# Patient Record
Sex: Male | Born: 1970 | Race: Black or African American | Hispanic: No | Marital: Married | Smoking: Never smoker
Health system: Southern US, Community
[De-identification: ages and names within clinical notes are randomized; demographics above are authoritative.]

## PROBLEM LIST (undated history)

## (undated) DIAGNOSIS — I209 Angina pectoris, unspecified: Secondary | ICD-10-CM

## (undated) DIAGNOSIS — R609 Edema, unspecified: Secondary | ICD-10-CM

## (undated) DIAGNOSIS — E7800 Pure hypercholesterolemia, unspecified: Secondary | ICD-10-CM

## (undated) DIAGNOSIS — J189 Pneumonia, unspecified organism: Secondary | ICD-10-CM

## (undated) DIAGNOSIS — R7303 Prediabetes: Secondary | ICD-10-CM

## (undated) DIAGNOSIS — T4145XA Adverse effect of unspecified anesthetic, initial encounter: Secondary | ICD-10-CM

## (undated) DIAGNOSIS — M1990 Unspecified osteoarthritis, unspecified site: Secondary | ICD-10-CM

## (undated) DIAGNOSIS — K219 Gastro-esophageal reflux disease without esophagitis: Secondary | ICD-10-CM

## (undated) DIAGNOSIS — G4730 Sleep apnea, unspecified: Secondary | ICD-10-CM

## (undated) DIAGNOSIS — I10 Essential (primary) hypertension: Secondary | ICD-10-CM

## (undated) DIAGNOSIS — T8859XA Other complications of anesthesia, initial encounter: Secondary | ICD-10-CM

## (undated) HISTORY — PX: OTHER SURGICAL HISTORY: SHX169

## (undated) HISTORY — DX: Essential (primary) hypertension: I10

## (undated) HISTORY — DX: Pure hypercholesterolemia, unspecified: E78.00

---

## 1998-07-18 ENCOUNTER — Emergency Department (HOSPITAL_COMMUNITY): Admission: EM | Admit: 1998-07-18 | Discharge: 1998-07-19 | Payer: Self-pay | Admitting: Emergency Medicine

## 1998-08-25 ENCOUNTER — Emergency Department (HOSPITAL_COMMUNITY): Admission: EM | Admit: 1998-08-25 | Discharge: 1998-08-25 | Payer: Self-pay | Admitting: Emergency Medicine

## 1998-09-07 ENCOUNTER — Emergency Department (HOSPITAL_COMMUNITY): Admission: EM | Admit: 1998-09-07 | Discharge: 1998-09-07 | Payer: Self-pay | Admitting: Emergency Medicine

## 2000-01-02 ENCOUNTER — Emergency Department (HOSPITAL_COMMUNITY): Admission: EM | Admit: 2000-01-02 | Discharge: 2000-01-02 | Payer: Self-pay | Admitting: Emergency Medicine

## 2000-01-02 ENCOUNTER — Encounter: Payer: Self-pay | Admitting: Emergency Medicine

## 2000-02-24 ENCOUNTER — Emergency Department (HOSPITAL_COMMUNITY): Admission: EM | Admit: 2000-02-24 | Discharge: 2000-02-24 | Payer: Self-pay | Admitting: Emergency Medicine

## 2003-01-21 ENCOUNTER — Encounter: Payer: Self-pay | Admitting: Emergency Medicine

## 2003-01-21 ENCOUNTER — Emergency Department (HOSPITAL_COMMUNITY): Admission: EM | Admit: 2003-01-21 | Discharge: 2003-01-21 | Payer: Self-pay | Admitting: Emergency Medicine

## 2003-09-26 ENCOUNTER — Emergency Department (HOSPITAL_COMMUNITY): Admission: AD | Admit: 2003-09-26 | Discharge: 2003-09-26 | Payer: Self-pay | Admitting: Family Medicine

## 2005-06-26 ENCOUNTER — Emergency Department (HOSPITAL_COMMUNITY): Admission: EM | Admit: 2005-06-26 | Discharge: 2005-06-26 | Payer: Self-pay | Admitting: Emergency Medicine

## 2005-09-12 ENCOUNTER — Emergency Department (HOSPITAL_COMMUNITY): Admission: EM | Admit: 2005-09-12 | Discharge: 2005-09-12 | Payer: Self-pay | Admitting: Emergency Medicine

## 2005-10-12 ENCOUNTER — Encounter: Admission: RE | Admit: 2005-10-12 | Discharge: 2005-10-12 | Payer: Self-pay | Admitting: Neurology

## 2005-10-23 ENCOUNTER — Encounter: Admission: RE | Admit: 2005-10-23 | Discharge: 2005-10-23 | Payer: Self-pay | Admitting: Neurology

## 2006-03-01 ENCOUNTER — Encounter: Payer: Self-pay | Admitting: Emergency Medicine

## 2009-02-18 ENCOUNTER — Emergency Department (HOSPITAL_COMMUNITY): Admission: EM | Admit: 2009-02-18 | Discharge: 2009-02-18 | Payer: Self-pay | Admitting: Emergency Medicine

## 2010-03-22 ENCOUNTER — Ambulatory Visit (HOSPITAL_COMMUNITY): Admission: RE | Admit: 2010-03-22 | Discharge: 2010-03-22 | Payer: Self-pay | Admitting: Orthopedic Surgery

## 2010-04-29 ENCOUNTER — Emergency Department (HOSPITAL_COMMUNITY): Admission: EM | Admit: 2010-04-29 | Discharge: 2010-04-29 | Payer: Self-pay | Admitting: Emergency Medicine

## 2010-11-11 ENCOUNTER — Observation Stay (HOSPITAL_COMMUNITY)
Admission: EM | Admit: 2010-11-11 | Discharge: 2010-11-12 | Payer: Self-pay | Source: Home / Self Care | Attending: Internal Medicine | Admitting: Internal Medicine

## 2010-11-15 LAB — HEMOGLOBIN A1C
Hgb A1c MFr Bld: 6 % — ABNORMAL HIGH (ref <5.7)
Mean Plasma Glucose: 126 mg/dL — ABNORMAL HIGH (ref <117)

## 2010-11-15 LAB — CARDIAC PANEL(CRET KIN+CKTOT+MB+TROPI)
CK, MB: 1.8 ng/mL (ref 0.3–4.0)
CK, MB: 1.8 ng/mL (ref 0.3–4.0)
Relative Index: 0.9 (ref 0.0–2.5)
Relative Index: 0.9 (ref 0.0–2.5)
Total CK: 206 U/L (ref 7–232)
Total CK: 211 U/L (ref 7–232)
Troponin I: <0.01 ng/mL (ref 0.00–0.06)
Troponin I: <0.01 ng/mL (ref 0.00–0.06)

## 2010-11-15 LAB — CBC
HCT: 43.4 % (ref 39.0–52.0)
Hemoglobin: 14.5 g/dL (ref 13.0–17.0)
MCH: 27.7 pg (ref 26.0–34.0)
MCHC: 33.4 g/dL (ref 30.0–36.0)
MCV: 82.8 fL (ref 78.0–100.0)
Platelets: 274 10*3/uL (ref 150–400)
RBC: 5.24 MIL/uL (ref 4.22–5.81)
RDW: 15 % (ref 11.5–15.5)
WBC: 5.2 10*3/uL (ref 4.0–10.5)

## 2010-11-15 LAB — DIFFERENTIAL
Basophils Absolute: 0 10*3/uL (ref 0.0–0.1)
Basophils Relative: 0 % (ref 0–1)
Eosinophils Absolute: 0 10*3/uL (ref 0.0–0.7)
Eosinophils Relative: 1 % (ref 0–5)
Lymphocytes Relative: 36 % (ref 12–46)
Lymphs Abs: 1.9 10*3/uL (ref 0.7–4.0)
Monocytes Absolute: 0.4 10*3/uL (ref 0.1–1.0)
Monocytes Relative: 9 % (ref 3–12)
Neutro Abs: 2.8 10*3/uL (ref 1.7–7.7)
Neutrophils Relative %: 54 % (ref 43–77)

## 2010-11-15 LAB — URINALYSIS, ROUTINE W REFLEX MICROSCOPIC
Bilirubin Urine: NEGATIVE
Hgb urine dipstick: NEGATIVE
Ketones, ur: NEGATIVE mg/dL
Nitrite: NEGATIVE
Protein, ur: NEGATIVE mg/dL
Specific Gravity, Urine: 1.023 (ref 1.005–1.030)
Urine Glucose, Fasting: NEGATIVE mg/dL
Urobilinogen, UA: 0.2 mg/dL (ref 0.0–1.0)
pH: 6.5 (ref 5.0–8.0)

## 2010-11-15 LAB — BASIC METABOLIC PANEL
BUN: 10 mg/dL (ref 6–23)
CO2: 26 mEq/L (ref 19–32)
Calcium: 9.5 mg/dL (ref 8.4–10.5)
Chloride: 104 mEq/L (ref 96–112)
Creatinine, Ser: 0.97 mg/dL (ref 0.4–1.5)
GFR calc Af Amer: >60 mL/min (ref >60)
GFR calc non Af Amer: >60 mL/min (ref >60)
Glucose, Bld: 89 mg/dL (ref 70–99)
Potassium: 4 mEq/L (ref 3.5–5.1)
Sodium: 139 mEq/L (ref 135–145)

## 2010-11-15 LAB — POCT CARDIAC MARKERS
CKMB, poc: 1 ng/mL (ref 1.0–8.0)
Myoglobin, poc: 99.5 ng/mL (ref 12–200)
Troponin i, poc: <0.05 ng/mL (ref 0.00–0.09)

## 2010-11-15 LAB — GLUCOSE, CAPILLARY
Glucose-Capillary: 77 mg/dL (ref 70–99)
Glucose-Capillary: 98 mg/dL (ref 70–99)

## 2010-11-15 LAB — CK TOTAL AND CKMB (NOT AT ARMC)
CK, MB: 1.7 ng/mL (ref 0.3–4.0)
Relative Index: 0.9 (ref 0.0–2.5)
Total CK: 188 U/L (ref 7–232)

## 2010-11-15 LAB — T4, FREE: Free T4: 1.11 ng/dL (ref 0.80–1.80)

## 2010-11-15 LAB — TROPONIN I: Troponin I: <0.01 ng/mL (ref 0.00–0.06)

## 2010-11-15 LAB — TSH: TSH: 1.762 u[IU]/mL (ref 0.350–4.500)

## 2010-11-19 DIAGNOSIS — R7301 Impaired fasting glucose: Secondary | ICD-10-CM | POA: Insufficient documentation

## 2010-11-30 NOTE — Consult Note (Signed)
NAME:  Duane, Olsen NO.:  [id number]  MEDICAL RECORD NO.:  [id number]          PATIENT TYPE:  INP  LOCATION:  3702                         FACILITY:  [facility]  PHYSICIAN:  Vesta Mixer, M.D. DATE OF BIRTH:  08/10/1971  DATE OF CONSULTATION:  11/11/2010 DATE OF DISCHARGE:                                CONSULTATION   [name] is a 40 year old black gentleman with a history of prediabetes.  He presents to the ER with some atypical chest pains and we were asked to see him.  Mr. Niblack has a history of palpitations.  He has worn a heart monitor in the past, but does not recall the results.  He does not recall which doctor he saw at our office.  The patient woke up last night at 2 a.m. with episodes of chest discomfort.  His wife was sleeping on his arm and his arm felt tingling.  He was not able to get back to sleep and developed chest pain.  The chest pain hurt almost all day and he had not had much relief.  He has had several times when he did notice the chest pain as much.  He presented to the ER today because of persistent episodes of chest pain.  He denies any nausea or vomiting.  He denies any shortness of breath.  He denies any syncope or presyncope.  The pain is described as a dull heaviness.  It seems to be worse when he lies down.  It is better when he sits forward.  The patient has not been getting any regular exercise.  He has lost 50 pounds in improved diet.  CURRENT MEDICATIONS:  None.  ALLERGIES:  None.  PAST MEDICAL HISTORY:  Metabolic syndrome.  SOCIAL HISTORY:  The patient is a nonsmoker.  He drinks alcohol socially.  He is currently unemployed.  He used to work at [employer].  FAMILY HISTORY:  Positive for hypertension, coronary artery disease, and diabetes mellitus.  REVIEW OF SYSTEMS:  As noted in the HPI.  He has been on an improved diet.  He has lost 50 pounds since last June.  PHYSICAL EXAMINATION:  GENERAL:  He is  a middle-aged gentleman in no acute distress.  He is alert and oriented x3 and his mood and affect are normal. VITAL SIGNS:  His blood pressure is 121/60, his heart rate is 63. HEENT:  His mucous membranes are moist.  His sclerae are nonicteric. NECK:  Supple.  2+ carotids.  He has no bruits, no JVD, no thyromegaly. LUNGS:  Clear. HEART:  Regular rate.  S1 and S2.  His PMI is nondisplaced. ABDOMEN:  Good bowel sounds.  He is morbidly obese.  He has no hepatosplenomegaly. EXTREMITIES:  No clubbing, cyanosis, or edema.  He has no palpable cords. NEURO:  Cranial nerves II through XII are intact and his motor and sensory function are intact.  His EKG reveals normal sinus rhythm.  He has T-wave inversions in the lateral leads consistent with LVH.  LABORATORY DATA:  His cardiac enzymes were negative x1 (14 hours after onset of chest pain).  His white blood cell count  5.2, hemoglobin is 14.5, hematocrit is 43.4.  Sodium is 139, potassium is 4.0, chloride is 104, CO2 is 26, BUN is 10, creatinine is 0.97.  His point-of-care markers are negative as noted above.  Chest x-ray reveals mild cardiomegaly.  His lungs are clear.  IMPRESSION AND PLAN:  Chest pain.  The patient's chest pain is very atypical.  Point-of-care enzymes performed 14 hours after the onset of chest pain are negative.  My recommendation would be to continue to draw cardiac enzymes.  Given his obesity, he is not a candidate for stress echo because of the poor two-stick windows.  I also think that a stress Myoview study would be fairly difficult to interpret.  I think that it will be safe to discharge him to home if his enzymes are negative tomorrow.  He can follow up with [place] at the office as needed.     Vesta Mixer, M.D.     PJN/MEDQ  D:  11/11/2010  T:  11/12/2010  Job:  [id number]  cc:   Ace Gins, MD  Electronically Signed by Kristeen Miss M.D. on 11/30/2010 12:13:57 PM

## 2010-12-02 NOTE — Discharge Summary (Signed)
  NAMEKRISTAN, [name]             ACCOUNT NO.:  [id number]  MEDICAL RECORD NO.:  [id number]          PATIENT TYPE:  INP  LOCATION:  3702                         FACILITY:  [facility]  PHYSICIAN:  Ladell Pier, M.D.   DATE OF BIRTH:  12/21/1970  DATE OF ADMISSION:  11/11/2010 DATE OF DISCHARGE:  11/12/2010                              DISCHARGE SUMMARY   DISCHARGE DIAGNOSES: 1. Noncardiac chest pain. 2. Diabetes. 3. Obesity.  DISCHARGE MEDICATIONS: 1. Protonix 40 mg daily. 2. Aspirin 81 mg daily.  FOLLOWUP APPOINTMENTS:  The patient is to follow up with Dr. Larina Bras next week and also to call and schedule an appointment to follow up with [facility], Dr. Elease Hashimoto,  PROCEDURE:  Chest x-ray was normal.  CONSULTANTS:  [facility], Vesta Mixer, MD  HISTORY OF PRESENT ILLNESS:  This is an obese 41 year old African American gentleman, borderline diabetes, reports that when he checked his blood pressure it was always normal, his blood sugar was always normal, who does have a history of chest pains, associated with palpitations.  He was investigated before by [facility] with that a 30-day Holter monitor, was negative.  Please see detailed admission note.  PAST MEDICAL HISTORY:  Per admission H and P.  FAMILY HISTORY:  Per admission H and P.  SOCIAL HISTORY:  Per admission H and P.  MEDICATIONS:  Per admission H and P.  ALLERGIES:  Per admission H and P.  REVIEW OF SYSTEMS:  Per admission H and P.  PHYSICAL EXAMINATION:  VITAL SIGNS:  Temperature 98.1, pulse of 57, respirations 22, blood pressure 137/76, pulse ox 98% room air. GENERAL:  The patient is laying in bed, well-nourished African American male. HEENT:  Normocephalic, atraumatic.  Pupils reactive to light without erythema. CARDIOVASCULAR:  Regular rate and rhythm. LUNGS:  Clear bilaterally. ABDOMEN:  Positive bowel sounds. EXTREMITIES:  Without edema.  HOSPITAL COURSE: 1. Chest pain:   The patient was admitted to the hospital.  He had     serial cardiac markers done that were negative.  Cardiology     evaluated the patient and did not feel this was cardiac etiology.     He will follow up in the office with them.  Thyroid function is     also normal. 2. Borderline diabetes.  The patient will monitor blood sugar. 3. Borderline hypertension.  The patient will monitor blood pressure.  DISCHARGE LABORATORY DATA:  CK 206, MB 1.8, troponin 3.01.  Hemoglobin A1c 6.  TSH of 1.762.  Sodium 139, potassium 4.0, chloride 104, CO2 is 26, glucose 89, BUN 10, creatinine 0.97.  WBC 5.2, hemoglobin 14.5, MCV 82.8, platelet of 274.  Time spent with the patient doing this dictation is about 30 minutes.     Ladell Pier, M.D.     N[place]/MEDQ  D:  11/12/2010  T:  11/13/2010  Job:  [id number]  cc:   Dr. Larina Bras  Electronically Signed by Ladell Pier M.D. on 12/02/2010 01:35:22 PM

## 2010-12-16 DIAGNOSIS — F4322 Adjustment disorder with anxiety: Secondary | ICD-10-CM | POA: Insufficient documentation

## 2010-12-16 DIAGNOSIS — K219 Gastro-esophageal reflux disease without esophagitis: Secondary | ICD-10-CM | POA: Insufficient documentation

## 2011-02-07 ENCOUNTER — Emergency Department (HOSPITAL_COMMUNITY): Payer: Medicaid Other

## 2011-02-07 ENCOUNTER — Emergency Department (HOSPITAL_COMMUNITY)
Admission: EM | Admit: 2011-02-07 | Discharge: 2011-02-07 | Disposition: A | Discharge status: Home / Self Care | Payer: Medicaid Other | Attending: Emergency Medicine | Admitting: Emergency Medicine

## 2011-02-07 DIAGNOSIS — M25569 Pain in unspecified knee: Secondary | ICD-10-CM | POA: Insufficient documentation

## 2011-02-07 DIAGNOSIS — M25469 Effusion, unspecified knee: Secondary | ICD-10-CM | POA: Insufficient documentation

## 2011-02-09 LAB — DIFFERENTIAL
Basophils Absolute: 0 10*3/uL (ref 0.0–0.1)
Basophils Relative: 0 % (ref 0–1)
Eosinophils Absolute: 0 10*3/uL (ref 0.0–0.7)
Eosinophils Relative: 0 % (ref 0–5)
Lymphocytes Relative: 11 % — ABNORMAL LOW (ref 12–46)
Lymphs Abs: 1.1 10*3/uL (ref 0.7–4.0)
Monocytes Absolute: 0.8 10*3/uL (ref 0.1–1.0)
Monocytes Relative: 8 % (ref 3–12)
Neutro Abs: 8.5 10*3/uL — ABNORMAL HIGH (ref 1.7–7.7)
Neutrophils Relative %: 81 % — ABNORMAL HIGH (ref 43–77)

## 2011-02-09 LAB — POCT I-STAT, CHEM 8
BUN: 9 mg/dL (ref 6–23)
Calcium, Ion: 1.07 mmol/L — ABNORMAL LOW (ref 1.12–1.32)
Chloride: 104 mEq/L (ref 96–112)
Creatinine, Ser: 1.3 mg/dL (ref 0.4–1.5)
Glucose, Bld: 112 mg/dL — ABNORMAL HIGH (ref 70–99)
HCT: 50 % (ref 39.0–52.0)
Hemoglobin: 17 g/dL (ref 13.0–17.0)
Potassium: 4 mEq/L (ref 3.5–5.1)
Sodium: 139 mEq/L (ref 135–145)
TCO2: 27 mmol/L (ref 0–100)

## 2011-02-09 LAB — CBC
HCT: 45.4 % (ref 39.0–52.0)
Hemoglobin: 15.4 g/dL (ref 13.0–17.0)
MCHC: 33.9 g/dL (ref 30.0–36.0)
MCV: 84.5 fL (ref 78.0–100.0)
Platelets: 281 10*3/uL (ref 150–400)
RBC: 5.38 MIL/uL (ref 4.22–5.81)
RDW: 15.7 % — ABNORMAL HIGH (ref 11.5–15.5)
WBC: 10.4 10*3/uL (ref 4.0–10.5)

## 2011-02-09 LAB — RAPID STREP SCREEN (MED CTR MEBANE ONLY): Streptococcus, Group A Screen (Direct): NEGATIVE

## 2011-03-03 DIAGNOSIS — S8990XA Unspecified injury of unspecified lower leg, initial encounter: Secondary | ICD-10-CM | POA: Insufficient documentation

## 2011-03-03 DIAGNOSIS — S99919A Unspecified injury of unspecified ankle, initial encounter: Secondary | ICD-10-CM | POA: Insufficient documentation

## 2011-03-03 DIAGNOSIS — M2550 Pain in unspecified joint: Secondary | ICD-10-CM | POA: Insufficient documentation

## 2011-08-09 DIAGNOSIS — IMO0002 Reserved for concepts with insufficient information to code with codable children: Secondary | ICD-10-CM | POA: Insufficient documentation

## 2011-08-09 DIAGNOSIS — J329 Chronic sinusitis, unspecified: Secondary | ICD-10-CM | POA: Insufficient documentation

## 2011-08-19 DIAGNOSIS — N529 Male erectile dysfunction, unspecified: Secondary | ICD-10-CM | POA: Insufficient documentation

## 2011-08-26 DIAGNOSIS — G4700 Insomnia, unspecified: Secondary | ICD-10-CM | POA: Insufficient documentation

## 2011-08-26 DIAGNOSIS — F32A Depression, unspecified: Secondary | ICD-10-CM | POA: Insufficient documentation

## 2011-08-26 DIAGNOSIS — F329 Major depressive disorder, single episode, unspecified: Secondary | ICD-10-CM | POA: Insufficient documentation

## 2012-01-04 ENCOUNTER — Emergency Department (HOSPITAL_COMMUNITY)
Admission: EM | Admit: 2012-01-04 | Discharge: 2012-01-04 | Disposition: A | Discharge status: Home / Self Care | Payer: Medicaid Other | Attending: Emergency Medicine | Admitting: Emergency Medicine

## 2012-01-04 ENCOUNTER — Emergency Department (HOSPITAL_COMMUNITY): Payer: Medicaid Other

## 2012-01-04 ENCOUNTER — Encounter (HOSPITAL_COMMUNITY): Payer: Self-pay | Admitting: *Deleted

## 2012-01-04 ENCOUNTER — Other Ambulatory Visit: Payer: Self-pay

## 2012-01-04 DIAGNOSIS — W010XXA Fall on same level from slipping, tripping and stumbling without subsequent striking against object, initial encounter: Secondary | ICD-10-CM | POA: Insufficient documentation

## 2012-01-04 DIAGNOSIS — T1490XA Injury, unspecified, initial encounter: Secondary | ICD-10-CM | POA: Insufficient documentation

## 2012-01-04 DIAGNOSIS — R209 Unspecified disturbances of skin sensation: Secondary | ICD-10-CM | POA: Insufficient documentation

## 2012-01-04 DIAGNOSIS — M542 Cervicalgia: Secondary | ICD-10-CM | POA: Insufficient documentation

## 2012-01-04 DIAGNOSIS — R079 Chest pain, unspecified: Secondary | ICD-10-CM | POA: Insufficient documentation

## 2012-01-04 DIAGNOSIS — W19XXXA Unspecified fall, initial encounter: Secondary | ICD-10-CM

## 2012-01-04 DIAGNOSIS — R110 Nausea: Secondary | ICD-10-CM | POA: Insufficient documentation

## 2012-01-04 DIAGNOSIS — R51 Headache: Secondary | ICD-10-CM | POA: Insufficient documentation

## 2012-01-04 LAB — POCT I-STAT, CHEM 8
BUN: 10 mg/dL (ref 6–23)
Calcium, Ion: 1.16 mmol/L (ref 1.12–1.32)
Chloride: 103 mEq/L (ref 96–112)
Creatinine, Ser: 1.2 mg/dL (ref 0.50–1.35)
Glucose, Bld: 95 mg/dL (ref 70–99)
HCT: 47 % (ref 39.0–52.0)
Hemoglobin: 16 g/dL (ref 13.0–17.0)
Potassium: 3.8 mEq/L (ref 3.5–5.1)
Sodium: 142 mEq/L (ref 135–145)
TCO2: 27 mmol/L (ref 0–100)

## 2012-01-04 LAB — POCT I-STAT TROPONIN I
Troponin i, poc: 0 ng/mL (ref 0.00–0.08)
Troponin i, poc: 0 ng/mL (ref 0.00–0.08)

## 2012-01-04 MED ORDER — IBUPROFEN 800 MG PO TABS: 800.0000 mg | ORAL_TABLET | Freq: Three times a day (TID) | ORAL | Status: AC

## 2012-01-04 MED ORDER — HYDROCODONE-ACETAMINOPHEN 5-325 MG PO TABS
2.0000 | ORAL_TABLET | Freq: Once | ORAL | Status: AC
  Administered 2012-01-04: 2 via ORAL
  Filled 2012-01-04: qty 2

## 2012-01-04 NOTE — ED Notes (Signed)
Patient reports that he tripped over a toy landing on the top of his head. Patients states he now has severe posterior, neck, shoulder pain, and pain that radiates to his hips. Patient states yesterday he had numbness in his hands, but none today. Patient denies blurred vision. PERRL-58mm. MAE. C-collar on.

## 2012-01-04 NOTE — Discharge Instructions (Signed)
Chest Pain (Nonspecific) It is often hard to give a specific diagnosis for the cause of chest pain. There is always a chance that your pain could be related to something serious, such as a heart attack or a blood clot in the lungs. You need to follow up with your caregiver for further evaluation. CAUSES   Heartburn.   Pneumonia or bronchitis.   Anxiety or stress.   Inflammation around your heart (pericarditis) or lung (pleuritis or pleurisy).   A blood clot in the lung.   A collapsed lung (pneumothorax). It can develop suddenly on its own (spontaneous pneumothorax) or from injury (trauma) to the chest.   Shingles infection (herpes zoster virus).  The chest wall is composed of bones, muscles, and cartilage. Any of these can be the source of the pain.  The bones can be bruised by injury.   The muscles or cartilage can be strained by coughing or overwork.   The cartilage can be affected by inflammation and become sore (costochondritis).  DIAGNOSIS  Lab tests or other studies, such as X-rays, electrocardiography, stress testing, or cardiac imaging, may be needed to find the cause of your pain.  TREATMENT   Treatment depends on what may be causing your chest pain. Treatment may include:   Acid blockers for heartburn.   Anti-inflammatory medicine.   Pain medicine for inflammatory conditions.   Antibiotics if an infection is present.   You may be advised to change lifestyle habits. This includes stopping smoking and avoiding alcohol, caffeine, and chocolate.   You may be advised to keep your head raised (elevated) when sleeping. This reduces the chance of acid going backward from your stomach into your esophagus.   Most of the time, nonspecific chest pain will improve within 2 to 3 days with rest and mild pain medicine.  HOME CARE INSTRUCTIONS   If antibiotics were prescribed, take your antibiotics as directed. Finish them even if you start to feel better.   For the next few  days, avoid physical activities that bring on chest pain. Continue physical activities as directed.   Do not smoke.   Avoid drinking alcohol.   Only take over-the-counter or prescription medicine for pain, discomfort, or fever as directed by your caregiver.   Follow your caregiver's suggestions for further testing if your chest pain does not go away.   Keep any follow-up appointments you made. If you do not go to an appointment, you could develop lasting (chronic) problems with pain. If there is any problem keeping an appointment, you must call to reschedule.  SEEK MEDICAL CARE IF:   You think you are having problems from the medicine you are taking. Read your medicine instructions carefully.   Your chest pain does not go away, even after treatment.   You develop a rash with blisters on your chest.  SEEK IMMEDIATE MEDICAL CARE IF:   You have increased chest pain or pain that spreads to your arm, neck, jaw, back, or abdomen.   You develop shortness of breath, an increasing cough, or you are coughing up blood.   You have severe back or abdominal pain, feel nauseous, or vomit.   You develop severe weakness, fainting, or chills.   You have a fever.  THIS IS AN EMERGENCY. Do not wait to see if the pain will go away. Get medical help at once. Call your local emergency services (911 in [place] Do not drive yourself to the hospital. MAKE SURE YOU:   Understand these instructions.     Will watch your condition.   Will get help right away if you are not doing well or get worse.  Document Released: 07/27/2005 Document Revised: 10/06/2011 Document Reviewed: 05/22/2008 Exit[employer] Patient Information 2012 [employer]. 

## 2012-01-04 NOTE — ED Provider Notes (Signed)
History     CSN: [id number]  Arrival date & time 01/04/12  1412   First MD Initiated Contact with Patient 01/04/12 1453      Chief Complaint  Patient presents with  . Fall    pt reports falling on yesterday and landing on his head. pt c/o neck pain and back pain. states he feels numbness down legs. pt ambulatory at present. large c-collar applied in triage.     (Consider location/radiation/quality/duration/timing/severity/associated sxs/prior treatment) HPI Comments: Patient sustained a mechanical fall yesterday after tripping over his son.  He thought he was going to roll forward over him but landed on his head and back. Denies LOC.  Now having pain thoughout neck and back.  Denies bowel or bladder incontinence, fever, vomiting.  Today he felt some nausea and some right sided chest pain around noon which provoked him to seek evaluation today.  The pain did not radiate and now is gone.  This pain lasted about 15 minutes and did not radiate and is similar to chest pain that he had January 2012 when he was admitted and had a negative cardiac workup.  No SOB, diaphoresis, vomiting.  No weakness in legs.  The history is provided by the patient.    History reviewed. No pertinent past medical history.  Past Surgical History  Procedure Date  . Tonsillectomy     Family History  Problem Relation Age of Onset  . Diabetes Mother   . Hypertension Mother   . Hypertension Father   . Diabetes Father     History  Substance Use Topics  . Smoking status: Never Smoker   . Smokeless tobacco: Never Used  . Alcohol Use: Yes     once a month      Review of Systems  Constitutional: Negative for activity change and appetite change.  HENT: Positive for neck pain. Negative for congestion and rhinorrhea.   Respiratory: Positive for chest tightness. Negative for cough and shortness of breath.   Cardiovascular: Positive for chest pain. Negative for leg swelling.  Gastrointestinal: Positive for  nausea. Negative for vomiting and abdominal pain.  Genitourinary: Negative for dysuria and hematuria.  Musculoskeletal: Positive for back pain. Negative for gait problem.  Skin: Negative for wound.  Neurological: Positive for headaches. Negative for weakness.    Allergies  Review of patient's allergies indicates no known allergies.  Home Medications   Current Outpatient Rx  Name Route Sig Dispense Refill  . CALCIUM CARBONATE ANTACID 500 MG PO CHEW Oral Chew 2 tablets by mouth daily.    . IBUPROFEN 200 MG PO TABS Oral Take 800 mg by mouth every 6 (six) hours as needed. For pain    . IBUPROFEN 800 MG PO TABS Oral Take 1 tablet (800 mg total) by mouth 3 (three) times daily. 21 tablet 0    BP 151/74  Pulse 83  Temp(Src) 98.5 F (36.9 C) (Oral)  Resp 15  Wt 300 lb (136.079 kg)  SpO2 96%  Physical Exam  Constitutional: He appears well-developed and well-nourished. No distress.  HENT:  Head: Normocephalic and atraumatic.  Mouth/Throat: Oropharynx is clear and moist. No oropharyngeal exudate.  Eyes: Conjunctivae and EOM are normal. Pupils are equal, round, and reactive to light.  Neck: Normal range of motion. Neck supple.       Paraspinal muscle tenderness  Cardiovascular: Normal rate, regular rhythm and normal heart sounds.   Pulmonary/Chest: Effort normal and breath sounds normal. No respiratory distress. He exhibits tenderness.  Reproducible R chest wall tenderness  Abdominal: Soft. There is no tenderness. There is no rebound and no guarding.  Musculoskeletal: Normal range of motion. He exhibits tenderness.       Bilateral paraspinal thoracic and lumbar tenderness  Neurological: He is alert.       5/5 strength throughout, no facial droop, Great toe dorisflexion intact.  +2 DP and PT pulses.  +2 patellar reflexes.  Skin: Skin is warm.    ED Course  Procedures (including critical care time)   Labs Reviewed  POCT I-STAT, CHEM 8  POCT I-STAT TROPONIN I  POCT I-STAT  TROPONIN I   Dg Chest 2 View  01/04/2012  *RADIOLOGY REPORT*  Clinical Data: Chest pain.  CHEST - 2 VIEW  Comparison: 11/11/2010.  Findings: The cardiac silhouette, mediastinal and hilar contours are within normal limits and stable.  The lungs are clear.  No pleural effusion.  The bony thorax is intact.  IMPRESSION: No acute cardiopulmonary findings.  Original Report Authenticated By: P. Loralie Champagne, M.D.   Ct Head Wo Contrast  01/04/2012  *RADIOLOGY REPORT*  Clinical Data:  History of fall complaining of head and neck pain. Bilateral leg numbness.  CT HEAD WITHOUT CONTRAST CT CERVICAL SPINE WITHOUT CONTRAST  Technique:  Multidetector CT imaging of the head and cervical spine was performed following the standard protocol without intravenous contrast.  Multiplanar CT image reconstructions of the cervical spine were also generated.  Comparison:  CT head 02/18/2009.  CT HEAD  Findings: No acute intracranial abnormalities.  Specifically, no evidence of acute intracranial hemorrhage, mass, mass effect, hydrocephalus or abnormal intra or extra-axial fluid collections. No displaced skull fractures are noted.  Visualized paranasal sinuses and mastoids are well pneumatized.  IMPRESSION: 1.  No evidence of significant acute traumatic injury to the head. 2.  The appearance of the brain is normal.  CT CERVICAL SPINE  Findings: No acute displaced fracture of the cervical spine is noted.  Incomplete posterior ring of C1 (normal anatomical variant) incidentally noted.  At C2-C3 there is a prominent bridging anterior osteophyte.  Visualized lung apices are unremarkable.  No focal soft tissue abnormality is appreciated.  Specifically, prevertebral soft tissues are normal.  IMPRESSION: 1.  No evidence to suggest a significant acute traumatic injury to the cervical spine.  Original Report Authenticated By: Florencia Reasons, M.D.   Ct Cervical Spine Wo Contrast  01/04/2012  *RADIOLOGY REPORT*  Clinical Data:  History of fall  complaining of head and neck pain. Bilateral leg numbness.  CT HEAD WITHOUT CONTRAST CT CERVICAL SPINE WITHOUT CONTRAST  Technique:  Multidetector CT imaging of the head and cervical spine was performed following the standard protocol without intravenous contrast.  Multiplanar CT image reconstructions of the cervical spine were also generated.  Comparison:  CT head 02/18/2009.  CT HEAD  Findings: No acute intracranial abnormalities.  Specifically, no evidence of acute intracranial hemorrhage, mass, mass effect, hydrocephalus or abnormal intra or extra-axial fluid collections. No displaced skull fractures are noted.  Visualized paranasal sinuses and mastoids are well pneumatized.  IMPRESSION: 1.  No evidence of significant acute traumatic injury to the head. 2.  The appearance of the brain is normal.  CT CERVICAL SPINE  Findings: No acute displaced fracture of the cervical spine is noted.  Incomplete posterior ring of C1 (normal anatomical variant) incidentally noted.  At C2-C3 there is a prominent bridging anterior osteophyte.  Visualized lung apices are unremarkable.  No focal soft tissue abnormality is appreciated.  Specifically, prevertebral  soft tissues are normal.  IMPRESSION: 1.  No evidence to suggest a significant acute traumatic injury to the cervical spine.  Original Report Authenticated By: Florencia Reasons, M.D.     1. Fall   2. Chest pain       MDM  Mechanical fall with head, neck, back pain.  No LOC, no neuro deficits.  Now with R sided reproducible chest pain.  Patient has no traumatic injuries. He does have atypical right-sided chest pain is similar to what he had last year. Been going on for about an hour tonight.  EKG shows no changes, delta troponin is negative his pain is somewhat reproducible on exam. Is encouraged to followup with his primary care doctor for stress test      Date: 01/04/2012  Rate: 78  Rhythm: normal sinus rhythm  QRS Axis: normal  Intervals: normal   ST/T Wave abnormalities: nonspecific ST/T changes  Conduction Disutrbances:none  Narrative Interpretation: III T wave now upright  Old EKG Reviewed: unchanged    Glynn Octave, MD 01/04/12 2352

## 2012-03-20 ENCOUNTER — Ambulatory Visit (HOSPITAL_BASED_OUTPATIENT_CLINIC_OR_DEPARTMENT_OTHER): Payer: Medicaid Other

## 2012-03-29 ENCOUNTER — Ambulatory Visit (HOSPITAL_BASED_OUTPATIENT_CLINIC_OR_DEPARTMENT_OTHER): Payer: Medicaid Other

## 2012-03-29 ENCOUNTER — Emergency Department (HOSPITAL_COMMUNITY)
Admission: EM | Admit: 2012-03-29 | Discharge: 2012-03-29 | Discharge status: Against Medical Advice | Payer: Medicaid Other | Attending: Emergency Medicine | Admitting: Emergency Medicine

## 2012-03-29 ENCOUNTER — Encounter (HOSPITAL_COMMUNITY): Payer: Self-pay | Admitting: Emergency Medicine

## 2012-03-29 ENCOUNTER — Emergency Department (HOSPITAL_COMMUNITY): Payer: Medicaid Other

## 2012-03-29 DIAGNOSIS — R079 Chest pain, unspecified: Secondary | ICD-10-CM | POA: Insufficient documentation

## 2012-03-29 DIAGNOSIS — R55 Syncope and collapse: Secondary | ICD-10-CM | POA: Insufficient documentation

## 2012-03-29 DIAGNOSIS — R0602 Shortness of breath: Secondary | ICD-10-CM | POA: Insufficient documentation

## 2012-03-29 HISTORY — DX: Angina pectoris, unspecified: I20.9

## 2012-03-29 LAB — DIFFERENTIAL
Basophils Absolute: 0 10*3/uL (ref 0.0–0.1)
Basophils Relative: 0 % (ref 0–1)
Eosinophils Absolute: 0.1 10*3/uL (ref 0.0–0.7)
Eosinophils Relative: 1 % (ref 0–5)
Lymphocytes Relative: 34 % (ref 12–46)
Lymphs Abs: 2.7 10*3/uL (ref 0.7–4.0)
Monocytes Absolute: 0.8 10*3/uL (ref 0.1–1.0)
Monocytes Relative: 11 % (ref 3–12)
Neutro Abs: 4.3 10*3/uL (ref 1.7–7.7)
Neutrophils Relative %: 54 % (ref 43–77)

## 2012-03-29 LAB — BASIC METABOLIC PANEL
BUN: 12 mg/dL (ref 6–23)
CO2: 26 mEq/L (ref 19–32)
Calcium: 9.5 mg/dL (ref 8.4–10.5)
Chloride: 100 mEq/L (ref 96–112)
Creatinine, Ser: 0.95 mg/dL (ref 0.50–1.35)
GFR calc Af Amer: >90 mL/min (ref >90)
GFR calc non Af Amer: >90 mL/min (ref >90)
Glucose, Bld: 91 mg/dL (ref 70–99)
Potassium: 4.1 mEq/L (ref 3.5–5.1)
Sodium: 138 mEq/L (ref 135–145)

## 2012-03-29 LAB — CBC
HCT: 43.8 % (ref 39.0–52.0)
Hemoglobin: 14.6 g/dL (ref 13.0–17.0)
MCH: 28 pg (ref 26.0–34.0)
MCHC: 33.3 g/dL (ref 30.0–36.0)
MCV: 84.1 fL (ref 78.0–100.0)
Platelets: 327 10*3/uL (ref 150–400)
RBC: 5.21 MIL/uL (ref 4.22–5.81)
RDW: 14.4 % (ref 11.5–15.5)
WBC: 7.9 10*3/uL (ref 4.0–10.5)

## 2012-03-29 LAB — D-DIMER, QUANTITATIVE: D-Dimer, Quant: <0.22 ug/mL-FEU (ref 0.00–0.48)

## 2012-03-29 LAB — PRO B NATRIURETIC PEPTIDE: Pro B Natriuretic peptide (BNP): 70.3 pg/mL (ref 0–125)

## 2012-03-29 LAB — TROPONIN I: Troponin I: <0.3 ng/mL (ref <0.30)

## 2012-03-29 NOTE — ED Notes (Signed)
Pt presenting to ed with c/o being at work and developing left sided chest pain with shortness of breath, lightheadedness, and dizziness. Pt denies radiating pain at this time. Pt denies nausea and vomiting. Pt states he passed at at work. Pt states he fell onto his right side

## 2012-03-29 NOTE — ED Provider Notes (Signed)
History     CSN: [id number]  Arrival date & time 03/29/12  1630   First MD Initiated Contact with Patient 03/29/12 1704      Chief Complaint  Patient presents with  . Shortness of Breath  . Loss of Consciousness  . Chest Pain    (Consider location/radiation/quality/duration/timing/severity/associated sxs/prior treatment) Patient is a 41 y.o. male presenting with shortness of breath, syncope, and chest pain. The history is provided by the patient.  Shortness of Breath  The current episode started today. The problem has been gradually improving. The problem is moderate. The symptoms are relieved by nothing. Associated symptoms include chest pain and shortness of breath. Pertinent negatives include no fever and no cough. Associated symptoms comments: The patient reports onset of shortness of breath while standing talking with someone. He states his breathing became worse, he felt his heart "skipping" and had left sided chest discomfort. He reports brief episode of syncope. He reports a steady weight gain of over 50 pounds over the last 6 months and that his legs have begun to swell regularly. .  Loss of Consciousness Associated symptoms include chest pain. Pertinent negatives include no abdominal pain, coughing, fever, nausea or vomiting.  Chest Pain Primary symptoms include syncope and shortness of breath. Pertinent negatives for primary symptoms include no fever, no cough, no abdominal pain, no nausea and no vomiting.  There was no nausea. The syncopal episode occurred with shortness of breath.     Past Medical History  Diagnosis Date  . Anginal pain     04/2011 pt had agina, seen by cardiologist, no further treatment    Past Surgical History  Procedure Date  . Tonsillectomy     Family History  Problem Relation Age of Onset  . Diabetes Mother   . Hypertension Mother   . Hypertension Father   . Diabetes Father     History  Substance Use Topics  . Smoking status: Never  Smoker   . Smokeless tobacco: Never Used  . Alcohol Use: Yes     once a month      Review of Systems  Constitutional: Negative for fever.  Respiratory: Positive for shortness of breath. Negative for cough.   Cardiovascular: Positive for chest pain and syncope.  Gastrointestinal: Negative for nausea, vomiting and abdominal pain.  Neurological: Positive for syncope.    Allergies  Review of patient's allergies indicates no known allergies.  Home Medications   Current Outpatient Rx  Name Route Sig Dispense Refill  . ASPIRIN 81 MG PO CHEW Oral Chew 81 mg by mouth daily.      BP 157/82  Pulse 73  Temp(Src) 98.4 F (36.9 C) (Oral)  Resp 20  SpO2 98%  Physical Exam  Constitutional: He is oriented to person, place, and time. He appears well-developed and well-nourished. No distress.  HENT:  Head: Normocephalic.  Neck: Normal range of motion.  Cardiovascular: Normal rate and regular rhythm.   No murmur heard. Pulmonary/Chest: Effort normal. He has no wheezes. He has no rales. He exhibits no tenderness.  Abdominal: Soft. He exhibits no mass.       Obese abdomen that is soft, nontender.   Musculoskeletal: Normal range of motion. He exhibits edema.       Bilateral 1+ edema lower extremities.  Neurological: He is alert and oriented to person, place, and time. Coordination normal.  Skin: Skin is warm and dry.  Psychiatric: He has a normal mood and affect.    ED Course  Procedures (including  critical care time)   Labs Reviewed  CBC  DIFFERENTIAL  BASIC METABOLIC PANEL  D-DIMER, QUANTITATIVE  PRO B NATRIURETIC PEPTIDE  TROPONIN I   Results for orders placed during the hospital encounter of 03/29/12  CBC      Component Value Range   WBC 7.9  4.0 - 10.5 (K/uL)   RBC 5.21  4.22 - 5.81 (MIL/uL)   Hemoglobin 14.6  13.0 - 17.0 (g/dL)   HCT 16.1  09.6 - 04.5 (%)   MCV 84.1  78.0 - 100.0 (fL)   MCH 28.0  26.0 - 34.0 (pg)   MCHC 33.3  30.0 - 36.0 (g/dL)   RDW 40.9  81.1  - 91.4 (%)   Platelets 327  150 - 400 (K/uL)  DIFFERENTIAL      Component Value Range   Neutrophils Relative 54  43 - 77 (%)   Neutro Abs 4.3  1.7 - 7.7 (K/uL)   Lymphocytes Relative 34  12 - 46 (%)   Lymphs Abs 2.7  0.7 - 4.0 (K/uL)   Monocytes Relative 11  3 - 12 (%)   Monocytes Absolute 0.8  0.1 - 1.0 (K/uL)   Eosinophils Relative 1  0 - 5 (%)   Eosinophils Absolute 0.1  0.0 - 0.7 (K/uL)   Basophils Relative 0  0 - 1 (%)   Basophils Absolute 0.0  0.0 - 0.1 (K/uL)  BASIC METABOLIC PANEL      Component Value Range   Sodium 138  135 - 145 (mEq/L)   Potassium 4.1  3.5 - 5.1 (mEq/L)   Chloride 100  96 - 112 (mEq/L)   CO2 26  19 - 32 (mEq/L)   Glucose, Bld 91  70 - 99 (mg/dL)   BUN 12  6 - 23 (mg/dL)   Creatinine, Ser 7.82  0.50 - 1.35 (mg/dL)   Calcium 9.5  8.4 - 95.6 (mg/dL)   GFR calc non Af Amer >90  >90 (mL/min)   GFR calc Af Amer >90  >90 (mL/min)  D-DIMER, QUANTITATIVE      Component Value Range   D-Dimer, Quant <0.22  0.00 - 0.48 (ug/mL-FEU)  PRO B NATRIURETIC PEPTIDE      Component Value Range   Pro B Natriuretic peptide (BNP) 70.3  0 - 125 (pg/mL)  TROPONIN I      Component Value Range   Troponin I <0.30  <0.30 (ng/mL)  . Dg Chest Portable 1 View  03/29/2012  *RADIOLOGY REPORT*  Clinical Data: Shortness of breath with chest pain.  Loss of consciousness.  PORTABLE CHEST - 1 VIEW  Comparison: 01/04/2012  Findings:  Portable semi erect chest demonstrates low lung volumes with clear lung fields.  Cardiac and mediastinal silhouette within normal limits for the degree of inspiration.  No acute bony abnormality. No visible effusion or pneumothorax.  Worsening aeration compared with priors.  IMPRESSION: Low lung volumes.  No active infiltrates.  Original Report Authenticated By: Elsie Stain, M.D.   No results found.   No diagnosis found. 1. Syncope 2. Chest pain 3. SOB   MDM  The patient has been comfortable here without recurrent symptoms. Hospitalization was  recommended but patient declined and will sign out AMA. He has been advised of the risk of cardiac event that could result in death. He states he wants to go home regardless.         Rodena Medin, PA-C 03/29/12 2043

## 2012-03-29 NOTE — ED Notes (Signed)
Pt advised recommendation to stay.  Pt states he is not staying and MD is discharging home.

## 2012-03-29 NOTE — ED Notes (Addendum)
Pt presented to the ER with multiple complaints  States that about 10 min ago he was woken up by CP and while attempted to get up he experienced syncopal episode.

## 2012-03-29 NOTE — ED Notes (Signed)
Pt reports 1 hour ago developing central to right sided chest pain 10/10. Pt pt reports he felt "like couldn't catch his breath" and then lost consciousness for a few seconds and fell to ground on right side. C/o of right sided arm pain and right side of head/ neck pain. At present chest pain is 5/10. Arm pain 5/10. Pt has hx of 1 year ago having chest pain. Was seen by cardiologist who said "he had elevated enzymes". Cardiologist wanted pt to start taking cholesterol medication, but pt refused. Pt had hx of asthma at child. Denies n/v, blurry vision, or dizziness.

## 2012-03-29 NOTE — Discharge Instructions (Signed)
YOU HAVE BEEN ADVISED THAT HOSPITALIZATION IS RECOMMENDED AND THAT, BY CHOOSING TO LEAVE AND GO HOME, YOU ARE AT RISK FOR HAVING A CARDIAC EVENT THAT COULD BE FATAL. PLEASE RETURN ANYTIME YOU WANT FURTHER ASSESSMENT.

## 2012-03-29 NOTE — ED Provider Notes (Signed)
  I performed a history and physical examination of [name] and discussed his management with .Langley Adie I agree with the history, physical, assessment, and plan of care, with the following exceptions: None  I was present for the following procedures: None Time Spent in Critical Care of the patient: None Time spent in discussions with the patient and family:10  Patient advised admission.  Risk of death and disability discussed with patient. Patient does not want to stay and signed out ama.  Holli Humbles, MD 03/29/12 2330

## 2012-05-12 ENCOUNTER — Emergency Department (HOSPITAL_COMMUNITY): Payer: Medicaid Other

## 2012-05-12 ENCOUNTER — Encounter (HOSPITAL_COMMUNITY): Payer: Self-pay | Admitting: *Deleted

## 2012-05-12 ENCOUNTER — Emergency Department (HOSPITAL_COMMUNITY)
Admission: EM | Admit: 2012-05-12 | Discharge: 2012-05-12 | Disposition: A | Discharge status: Home / Self Care | Payer: Medicaid Other | Attending: Emergency Medicine | Admitting: Emergency Medicine

## 2012-05-12 DIAGNOSIS — X58XXXA Exposure to other specified factors, initial encounter: Secondary | ICD-10-CM | POA: Insufficient documentation

## 2012-05-12 DIAGNOSIS — K625 Hemorrhage of anus and rectum: Secondary | ICD-10-CM

## 2012-05-12 DIAGNOSIS — R109 Unspecified abdominal pain: Secondary | ICD-10-CM | POA: Insufficient documentation

## 2012-05-12 DIAGNOSIS — Z7982 Long term (current) use of aspirin: Secondary | ICD-10-CM | POA: Insufficient documentation

## 2012-05-12 DIAGNOSIS — IMO0002 Reserved for concepts with insufficient information to code with codable children: Secondary | ICD-10-CM | POA: Insufficient documentation

## 2012-05-12 DIAGNOSIS — K921 Melena: Secondary | ICD-10-CM | POA: Insufficient documentation

## 2012-05-12 DIAGNOSIS — M25569 Pain in unspecified knee: Secondary | ICD-10-CM | POA: Insufficient documentation

## 2012-05-12 DIAGNOSIS — S8390XA Sprain of unspecified site of unspecified knee, initial encounter: Secondary | ICD-10-CM

## 2012-05-12 DIAGNOSIS — Z79899 Other long term (current) drug therapy: Secondary | ICD-10-CM | POA: Insufficient documentation

## 2012-05-12 DIAGNOSIS — M25469 Effusion, unspecified knee: Secondary | ICD-10-CM | POA: Insufficient documentation

## 2012-05-12 LAB — COMPREHENSIVE METABOLIC PANEL
ALT: 20 U/L (ref 0–53)
AST: 18 U/L (ref 0–37)
Albumin: 3.8 g/dL (ref 3.5–5.2)
Alkaline Phosphatase: 64 U/L (ref 39–117)
BUN: 12 mg/dL (ref 6–23)
CO2: 26 mEq/L (ref 19–32)
Calcium: 9.1 mg/dL (ref 8.4–10.5)
Chloride: 103 mEq/L (ref 96–112)
Creatinine, Ser: 1 mg/dL (ref 0.50–1.35)
GFR calc Af Amer: >90 mL/min (ref >90)
GFR calc non Af Amer: >90 mL/min (ref >90)
Glucose, Bld: 91 mg/dL (ref 70–99)
Potassium: 3.8 mEq/L (ref 3.5–5.1)
Sodium: 137 mEq/L (ref 135–145)
Total Bilirubin: 0.5 mg/dL (ref 0.3–1.2)
Total Protein: 7.7 g/dL (ref 6.0–8.3)

## 2012-05-12 LAB — CBC WITH DIFFERENTIAL/PLATELET
Basophils Absolute: 0 10*3/uL (ref 0.0–0.1)
Basophils Relative: 1 % (ref 0–1)
Eosinophils Absolute: 0 10*3/uL (ref 0.0–0.7)
Eosinophils Relative: 1 % (ref 0–5)
HCT: 42.1 % (ref 39.0–52.0)
Hemoglobin: 14.4 g/dL (ref 13.0–17.0)
Lymphocytes Relative: 36 % (ref 12–46)
Lymphs Abs: 1.9 10*3/uL (ref 0.7–4.0)
MCH: 28.3 pg (ref 26.0–34.0)
MCHC: 34.2 g/dL (ref 30.0–36.0)
MCV: 82.7 fL (ref 78.0–100.0)
Monocytes Absolute: 0.4 10*3/uL (ref 0.1–1.0)
Monocytes Relative: 8 % (ref 3–12)
Neutro Abs: 2.9 10*3/uL (ref 1.7–7.7)
Neutrophils Relative %: 55 % (ref 43–77)
Platelets: 280 10*3/uL (ref 150–400)
RBC: 5.09 MIL/uL (ref 4.22–5.81)
RDW: 14.1 % (ref 11.5–15.5)
WBC: 5.3 10*3/uL (ref 4.0–10.5)

## 2012-05-12 MED ORDER — TRAMADOL HCL 50 MG PO TABS: 50.0000 mg | ORAL_TABLET | Freq: Four times a day (QID) | ORAL | Status: AC | PRN

## 2012-05-12 NOTE — ED Notes (Signed)
Pt reports he initially came in for right knee pain and then noted blood in stool this AM. Pt describes bright red blood in stool and reports he has noted smaller amounts of blood earlier in the week. Pt denies history of hemorrhoids. Pt reports generalized abdominal pain. Pt reports knee pain started a few hours ago after jumping off a step and twisting knee.

## 2012-05-12 NOTE — ED Provider Notes (Signed)
History     CSN: [id number]  Arrival date & time 05/12/12  1214   First MD Initiated Contact with Patient 05/12/12 1319      Chief Complaint  Patient presents with  . Rectal Bleeding  . Knee Pain  . Abdominal Pain    (Consider location/radiation/quality/duration/timing/severity/associated sxs/prior treatment) Patient is a 41 y.o. male presenting with hematochezia, knee pain, and abdominal pain. The history is provided by the patient (the pt complains of rectal bleeding and right knee pain). A language interpreter was used.  Rectal Bleeding  The current episode started today. The problem occurs rarely. The problem has been unchanged. The pain is mild. The stool is described as hard. There was no prior successful therapy. There was no prior unsuccessful therapy. Associated symptoms include abdominal pain. Pertinent negatives include no diarrhea, no hematuria, no chest pain, no headaches, no coughing and no rash.  Knee Pain Associated symptoms include abdominal pain. Pertinent negatives include no chest pain and no headaches.  Abdominal Pain The primary symptoms of the illness include abdominal pain and hematochezia. The primary symptoms of the illness do not include fatigue or diarrhea.  Symptoms associated with the illness do not include hematuria, frequency or back pain.    Past Medical History  Diagnosis Date  . Anginal pain     04/2011 pt had agina, seen by cardiologist, no further treatment    Past Surgical History  Procedure Date  . Tonsillectomy     Family History  Problem Relation Age of Onset  . Diabetes Mother   . Hypertension Mother   . Hypertension Father   . Diabetes Father     History  Substance Use Topics  . Smoking status: Never Smoker   . Smokeless tobacco: Never Used  . Alcohol Use: Yes     once a month      Review of Systems  Constitutional: Negative for fatigue.  HENT: Negative for congestion, sinus pressure and ear discharge.   Eyes:  Negative for discharge.  Respiratory: Negative for cough.   Cardiovascular: Negative for chest pain.  Gastrointestinal: Positive for abdominal pain and hematochezia. Negative for diarrhea.  Genitourinary: Negative for frequency and hematuria.  Musculoskeletal: Negative for back pain.       Pain in right knee  Skin: Negative for rash.  Neurological: Negative for seizures and headaches.  Hematological: Negative.   Psychiatric/Behavioral: Negative for hallucinations.    Allergies  Review of patient's allergies indicates no known allergies.  Home Medications   Current Outpatient Rx  Name Route Sig Dispense Refill  . ASPIRIN 81 MG PO CHEW Oral Chew 81 mg by mouth daily.    . ADULT MULTIVITAMIN W/MINERALS CH Oral Take 1 tablet by mouth daily.    . TRAMADOL HCL 50 MG PO TABS Oral Take 1 tablet (50 mg total) by mouth every 6 (six) hours as needed for pain. 30 tablet 0    BP 165/84  Pulse 61  Temp 98.1 F (36.7 C) (Oral)  Resp 18  SpO2 100%  Physical Exam  Constitutional: He is oriented to person, place, and time. He appears well-developed.  HENT:  Head: Normocephalic and atraumatic.  Eyes: Conjunctivae and EOM are normal. No scleral icterus.  Neck: Neck supple. No thyromegaly present.  Cardiovascular: Normal rate and regular rhythm.  Exam reveals no gallop and no friction rub.   No murmur heard. Pulmonary/Chest: No stridor. He has no wheezes. He has no rales. He exhibits no tenderness.  Abdominal: He exhibits no distension.  There is no tenderness. There is no rebound.  Genitourinary: Rectum normal.  Musculoskeletal:       Tender swollen right knee  Lymphadenopathy:    He has no cervical adenopathy.  Neurological: He is oriented to person, place, and time. Coordination normal.  Skin: No rash noted. No erythema.  Psychiatric: He has a normal mood and affect. His behavior is normal.    ED Course  Procedures (including critical care time)   Labs Reviewed  CBC WITH  DIFFERENTIAL  COMPREHENSIVE METABOLIC PANEL   Dg Knee Complete 4 Views Right  05/12/2012  *RADIOLOGY REPORT*  Clinical Data: Knee pain.  Jumping injury.  RIGHT KNEE - COMPLETE 4+ VIEW  Comparison: 02/07/2011  Findings: Minimal patellofemoral osteoarthritis.  A small suprapatellar joint effusion is again difficult to exclude. Appearance could partially be secondary patient body habitus.  IMPRESSION: Cannot exclude suprapatellar joint effusion.  Mild patellofemoral osteoarthritis. No acute osseous abnormality.  Original Report Authenticated By: Consuello Bossier, M.D.     1. Knee sprain   2. Rectal bleeding       MDM         Benny Lennert, MD 05/12/12 1539

## 2012-05-12 NOTE — ED Notes (Signed)
Ortho tech at bedside 

## 2012-06-02 ENCOUNTER — Other Ambulatory Visit: Payer: Self-pay | Admitting: Orthopedic Surgery

## 2012-06-02 DIAGNOSIS — M25569 Pain in unspecified knee: Secondary | ICD-10-CM

## 2012-06-06 ENCOUNTER — Ambulatory Visit
Admission: RE | Admit: 2012-06-06 | Discharge: 2012-06-06 | Disposition: A | Discharge status: Home / Self Care | Payer: Medicaid Other | Source: Ambulatory Visit | Attending: Orthopedic Surgery | Admitting: Orthopedic Surgery

## 2012-06-06 DIAGNOSIS — M25569 Pain in unspecified knee: Secondary | ICD-10-CM

## 2013-05-18 ENCOUNTER — Emergency Department (HOSPITAL_COMMUNITY)
Admission: EM | Admit: 2013-05-18 | Discharge: 2013-05-18 | Disposition: A | Discharge status: Home / Self Care | Payer: Medicaid Other | Attending: Emergency Medicine | Admitting: Emergency Medicine

## 2013-05-18 ENCOUNTER — Encounter (HOSPITAL_COMMUNITY): Payer: Self-pay

## 2013-05-18 ENCOUNTER — Emergency Department (HOSPITAL_COMMUNITY): Payer: Medicaid Other

## 2013-05-18 DIAGNOSIS — Z9119 Patient's noncompliance with other medical treatment and regimen: Secondary | ICD-10-CM | POA: Insufficient documentation

## 2013-05-18 DIAGNOSIS — R0789 Other chest pain: Secondary | ICD-10-CM | POA: Insufficient documentation

## 2013-05-18 DIAGNOSIS — R1110 Vomiting, unspecified: Secondary | ICD-10-CM | POA: Insufficient documentation

## 2013-05-18 DIAGNOSIS — Z7982 Long term (current) use of aspirin: Secondary | ICD-10-CM | POA: Insufficient documentation

## 2013-05-18 DIAGNOSIS — Z8679 Personal history of other diseases of the circulatory system: Secondary | ICD-10-CM | POA: Insufficient documentation

## 2013-05-18 DIAGNOSIS — G4733 Obstructive sleep apnea (adult) (pediatric): Secondary | ICD-10-CM | POA: Insufficient documentation

## 2013-05-18 DIAGNOSIS — Z91199 Patient's noncompliance with other medical treatment and regimen due to unspecified reason: Secondary | ICD-10-CM | POA: Insufficient documentation

## 2013-05-18 DIAGNOSIS — F17200 Nicotine dependence, unspecified, uncomplicated: Secondary | ICD-10-CM | POA: Insufficient documentation

## 2013-05-18 DIAGNOSIS — R0602 Shortness of breath: Secondary | ICD-10-CM | POA: Insufficient documentation

## 2013-05-18 DIAGNOSIS — R61 Generalized hyperhidrosis: Secondary | ICD-10-CM | POA: Insufficient documentation

## 2013-05-18 LAB — CBC WITH DIFFERENTIAL/PLATELET
Basophils Absolute: 0 10*3/uL (ref 0.0–0.1)
Basophils Relative: 0 % (ref 0–1)
Eosinophils Absolute: 0 10*3/uL (ref 0.0–0.7)
Eosinophils Relative: 1 % (ref 0–5)
HCT: 45.2 % (ref 39.0–52.0)
Hemoglobin: 15.3 g/dL (ref 13.0–17.0)
Lymphocytes Relative: 32 % (ref 12–46)
Lymphs Abs: 2 10*3/uL (ref 0.7–4.0)
MCH: 28 pg (ref 26.0–34.0)
MCHC: 33.8 g/dL (ref 30.0–36.0)
MCV: 82.8 fL (ref 78.0–100.0)
Monocytes Absolute: 0.6 10*3/uL (ref 0.1–1.0)
Monocytes Relative: 9 % (ref 3–12)
Neutro Abs: 3.5 10*3/uL (ref 1.7–7.7)
Neutrophils Relative %: 58 % (ref 43–77)
Platelets: 336 10*3/uL (ref 150–400)
RBC: 5.46 MIL/uL (ref 4.22–5.81)
RDW: 14.6 % (ref 11.5–15.5)
WBC: 6.1 10*3/uL (ref 4.0–10.5)

## 2013-05-18 LAB — COMPREHENSIVE METABOLIC PANEL
ALT: 17 U/L (ref 0–53)
AST: 20 U/L (ref 0–37)
Albumin: 4 g/dL (ref 3.5–5.2)
Alkaline Phosphatase: 78 U/L (ref 39–117)
BUN: 13 mg/dL (ref 6–23)
CO2: 25 mEq/L (ref 19–32)
Calcium: 9.6 mg/dL (ref 8.4–10.5)
Chloride: 99 mEq/L (ref 96–112)
Creatinine, Ser: 0.91 mg/dL (ref 0.50–1.35)
GFR calc Af Amer: >90 mL/min (ref >90)
GFR calc non Af Amer: >90 mL/min (ref >90)
Glucose, Bld: 97 mg/dL (ref 70–99)
Potassium: 4.4 mEq/L (ref 3.5–5.1)
Sodium: 136 mEq/L (ref 135–145)
Total Bilirubin: 0.4 mg/dL (ref 0.3–1.2)
Total Protein: 8.1 g/dL (ref 6.0–8.3)

## 2013-05-18 LAB — PRO B NATRIURETIC PEPTIDE: Pro B Natriuretic peptide (BNP): 33.2 pg/mL (ref 0–125)

## 2013-05-18 LAB — POCT I-STAT TROPONIN I: Troponin i, poc: 0 ng/mL (ref 0.00–0.08)

## 2013-05-18 LAB — D-DIMER, QUANTITATIVE: D-Dimer, Quant: <0.27 ug/mL-FEU (ref 0.00–0.48)

## 2013-05-18 LAB — TROPONIN I: Troponin I: <0.3 ng/mL (ref <0.30)

## 2013-05-18 NOTE — ED Notes (Signed)
PA Kelly at bedside. 

## 2013-05-18 NOTE — ED Notes (Addendum)
Confirmed with Zetta Bills in lab that the BNP is now added onto prior labs sent down. Shannon from lab called and the lavender clotted, will redraw; she stated that she will put in the order

## 2013-05-18 NOTE — ED Notes (Signed)
He states he was noted by co-workers at [employer] this morning that he had ceased his usual activity.  He noted clouded thinking for a brief interval, then noticed he was having central chest area discomfort, with some shortness of breath.  He denies diaphoresis.  He states he did vomit x 1 shortly after beginning of these sypmtoms.  He denies fever, nor any other sign of recent illness.  His skin is normal, warm and dry, and I do not see clinically any shortness of breath.  He states he takes a daily baby aspirin, which he states he took yesterday evening.

## 2013-05-18 NOTE — ED Notes (Addendum)
Pt is on RA now and states that he does not have any SOB, is not feeling as weak as when he came in and is now denying chest pain.  Pt states that he wants to go home.  RN encouraged him to wait for D-dimer results...RN explained what this test shows.

## 2013-05-18 NOTE — ED Provider Notes (Signed)
History    CSN: [id number] Arrival date & time 05/18/13  4098  First MD Initiated Contact with Patient 05/18/13 1015     Chief Complaint  Patient presents with  . Chest Pain   (Consider location/radiation/quality/duration/timing/severity/associated sxs/prior Treatment) HPI Comments: Patient is a 42 year old male with a history of sleep apnea and CPAP noncompliance who presents for central chest discomfort. Patient states the pain is sharp in nature and mildly radiating to his left chest. He states this sharp pain lasted a few seconds and was followed by a feeling of chest pressure. Patient denies any modifying factors, but states symptoms are associated with shortness of breath, diaphoresis, and one episode of nonbloody, nonbilious emesis. Patient states symptoms have been resolving since onset, but that he still feels come central pressure. He admits to a 5 pillow orthopnea. Patent denies associated fevers, vision changes, jaw pain, abdominal pain, numbness/tingling, extremity weakness, and leg swelling.  Patient is a 42 y.o. male presenting with chest pain. The history is provided by the patient. No language interpreter was used.  Chest Pain Associated symptoms: nausea, shortness of breath and vomiting (NB/NB)   Associated symptoms: no abdominal pain, no fever, no numbness and no weakness    Past Medical History  Diagnosis Date  . Anginal pain     04/2011 pt had agina, seen by cardiologist, no further treatment   Past Surgical History  Procedure Laterality Date  . Tonsillectomy     Family History  Problem Relation Age of Onset  . Diabetes Mother   . Hypertension Mother   . Hypertension Father   . Diabetes Father    History  Substance Use Topics  . Smoking status: Light Tobacco Smoker    Types: Cigars  . Smokeless tobacco: Never Used  . Alcohol Use: Yes     Comment: once a month    Review of Systems  Constitutional: Negative for fever.  Eyes: Negative for visual  disturbance.  Respiratory: Positive for shortness of breath.   Cardiovascular: Positive for chest pain.  Gastrointestinal: Positive for nausea and vomiting (NB/NB). Negative for abdominal pain.  Neurological: Negative for syncope, weakness and numbness.  All other systems reviewed and are negative.    Allergies  Review of patient's allergies indicates no known allergies.  Home Medications   Current Outpatient Rx  Name  Route  Sig  Dispense  Refill  . aspirin 81 MG chewable tablet   Oral   Chew 81 mg by mouth daily.         Marland Kitchen ibuprofen (ADVIL,MOTRIN) 200 MG tablet   Oral   Take 800 mg by mouth every 6 (six) hours as needed for pain.         . Multiple Vitamin (MULTIVITAMIN WITH MINERALS) TABS   Oral   Take 1 tablet by mouth daily.          BP 95/74  Pulse 69  Temp(Src) 98.7 F (37.1 C) (Oral)  Resp 18  Ht 5\' 5"  (1.651 m)  Wt 295 lb (133.811 kg)  BMI 49.09 kg/m2  SpO2 96%  Physical Exam  Nursing note and vitals reviewed. Constitutional: He is oriented to person, place, and time. He appears well-developed and well-nourished. No distress.  Obese male. Well and nontoxic appearing, and in no acute distress.  HENT:  Head: Normocephalic and atraumatic.  Mouth/Throat: Oropharynx is clear and moist. No oropharyngeal exudate.  Eyes: Conjunctivae and EOM are normal. Pupils are equal, round, and reactive to light. No scleral icterus.  Neck: Normal range of motion.  Cardiovascular: Normal rate, regular rhythm, normal heart sounds and intact distal pulses.   Pulmonary/Chest: No respiratory distress. He has no wheezes. He has no rales. He exhibits tenderness. He exhibits no crepitus, no deformity and no swelling.    Abdominal: Soft. There is no tenderness. There is no rebound and no guarding.  Soft obese abdomen with no tenderness to palpation  Musculoskeletal: Normal range of motion.  Neurological: He is alert and oriented to person, place, and time.  Skin: Skin is warm  and dry. No rash noted. He is not diaphoretic. No erythema. No pallor.  Psychiatric: He has a normal mood and affect. His behavior is normal.    ED Course  Procedures (including critical care time) Labs Reviewed  COMPREHENSIVE METABOLIC PANEL  PRO B NATRIURETIC PEPTIDE  CBC WITH DIFFERENTIAL  D-DIMER, QUANTITATIVE  TROPONIN I  CBC WITH DIFFERENTIAL  POCT I-STAT TROPONIN I   Dg Chest 2 View  05/18/2013   *RADIOLOGY REPORT*  Clinical Data: Chest pain, shortness of breath, nausea and vomiting  CHEST - 2 VIEW  Comparison: 03/29/2012  Findings: The heart size is normal.  There is mild central vascular congestion with no evidence of pulmonary edema or infiltrate. There are no pleural effusions.  IMPRESSION: Mild central vascular congestion.  Otherwise negative.   Original Report Authenticated By: Esperanza Heir, M.D.    Date: 05/19/2013  Rate: 65  Rhythm: sinus arrhythmia  QRS Axis: normal  Intervals: normal  ST/T Wave abnormalities: normal and nonspecific T wave changes  Conduction Disutrbances:none  Narrative Interpretation: Normal EKG; no STEMI  Old EKG Reviewed: unchanged from 01/04/2012 I have personally reviewed and interpreted this EKG.  1. Atypical chest pain    MDM  42 y/o male presents for chest pain with onset this AM. Patient well and nontoxic appearing on arrival and hemodynamically stable. Physical exam as above. Doubt ACS in this patient given atypical nature of symptoms, physical exam findings, and reassuring work up. No evidence of pneumonia, PTX, pleural effusion, or other acute cardiopulmonary abnormality on CXR. BNP normal and D dimer WNL, effectively r/o pulmonary embolism. Likely that symptoms were either reflux related or MSK in origin. Patient appropriate for d/c with PCP follow for further evaluation of symptoms. Indications for return provided and patient agreeable to plan.    Antony Madura, PA-C 05/19/13 2110

## 2013-05-18 NOTE — ED Provider Notes (Signed)
Medical screening examination/treatment/procedure(s) were conducted as a shared visit with non-physician practitioner(s) and myself.  I personally evaluated the patient during the encounter  Subjective: Patient here after having nausea vomiting with some epigastric chest discomfort which lasted for a few seconds.  Objective: Afebrile vital signs stable. EKG without ischemic changes.  Assessment/plan: We'll cycle troponins and likely discharge. Do not think that the patient has ACS or PE. Suspect GI source  Toy Baker, MD 05/18/13 [phone]

## 2013-05-20 NOTE — ED Provider Notes (Signed)
Medical screening examination/treatment/procedure(s) were performed by non-physician practitioner and as supervising physician I was immediately available for consultation/collaboration.  Darrelle Wiberg T Saree Krogh, MD 05/20/13 1117 

## 2013-05-23 ENCOUNTER — Emergency Department (HOSPITAL_COMMUNITY)
Admission: EM | Admit: 2013-05-23 | Discharge: 2013-05-23 | Disposition: A | Discharge status: Home / Self Care | Payer: Medicaid Other | Attending: Emergency Medicine | Admitting: Emergency Medicine

## 2013-05-23 ENCOUNTER — Encounter (HOSPITAL_COMMUNITY): Payer: Self-pay | Admitting: Emergency Medicine

## 2013-05-23 ENCOUNTER — Emergency Department (HOSPITAL_COMMUNITY): Payer: Medicaid Other

## 2013-05-23 DIAGNOSIS — R110 Nausea: Secondary | ICD-10-CM | POA: Insufficient documentation

## 2013-05-23 DIAGNOSIS — E669 Obesity, unspecified: Secondary | ICD-10-CM | POA: Insufficient documentation

## 2013-05-23 DIAGNOSIS — Z79899 Other long term (current) drug therapy: Secondary | ICD-10-CM | POA: Insufficient documentation

## 2013-05-23 DIAGNOSIS — R0602 Shortness of breath: Secondary | ICD-10-CM | POA: Insufficient documentation

## 2013-05-23 DIAGNOSIS — R079 Chest pain, unspecified: Secondary | ICD-10-CM

## 2013-05-23 DIAGNOSIS — Z9119 Patient's noncompliance with other medical treatment and regimen: Secondary | ICD-10-CM | POA: Insufficient documentation

## 2013-05-23 DIAGNOSIS — G4730 Sleep apnea, unspecified: Secondary | ICD-10-CM | POA: Insufficient documentation

## 2013-05-23 DIAGNOSIS — Z8679 Personal history of other diseases of the circulatory system: Secondary | ICD-10-CM | POA: Insufficient documentation

## 2013-05-23 DIAGNOSIS — Z7982 Long term (current) use of aspirin: Secondary | ICD-10-CM | POA: Insufficient documentation

## 2013-05-23 DIAGNOSIS — F17200 Nicotine dependence, unspecified, uncomplicated: Secondary | ICD-10-CM | POA: Insufficient documentation

## 2013-05-23 DIAGNOSIS — Z91199 Patient's noncompliance with other medical treatment and regimen due to unspecified reason: Secondary | ICD-10-CM | POA: Insufficient documentation

## 2013-05-23 LAB — CBC WITH DIFFERENTIAL/PLATELET
Basophils Absolute: 0 10*3/uL (ref 0.0–0.1)
Basophils Relative: 0 % (ref 0–1)
Eosinophils Absolute: 0 10*3/uL (ref 0.0–0.7)
Eosinophils Relative: 1 % (ref 0–5)
HCT: 41.8 % (ref 39.0–52.0)
Hemoglobin: 13.8 g/dL (ref 13.0–17.0)
Lymphocytes Relative: 30 % (ref 12–46)
Lymphs Abs: 2.1 10*3/uL (ref 0.7–4.0)
MCH: 27.7 pg (ref 26.0–34.0)
MCHC: 33 g/dL (ref 30.0–36.0)
MCV: 83.8 fL (ref 78.0–100.0)
Monocytes Absolute: 0.6 10*3/uL (ref 0.1–1.0)
Monocytes Relative: 8 % (ref 3–12)
Neutro Abs: 4.3 10*3/uL (ref 1.7–7.7)
Neutrophils Relative %: 61 % (ref 43–77)
Platelets: 308 10*3/uL (ref 150–400)
RBC: 4.99 MIL/uL (ref 4.22–5.81)
RDW: 14.5 % (ref 11.5–15.5)
WBC: 7 10*3/uL (ref 4.0–10.5)

## 2013-05-23 LAB — TROPONIN I: Troponin I: <0.3 ng/mL (ref <0.30)

## 2013-05-23 LAB — BASIC METABOLIC PANEL
BUN: 11 mg/dL (ref 6–23)
CO2: 27 mEq/L (ref 19–32)
Calcium: 9.1 mg/dL (ref 8.4–10.5)
Chloride: 101 mEq/L (ref 96–112)
Creatinine, Ser: 1.22 mg/dL (ref 0.50–1.35)
GFR calc Af Amer: 82 mL/min — ABNORMAL LOW (ref >90)
GFR calc non Af Amer: 71 mL/min — ABNORMAL LOW (ref >90)
Glucose, Bld: 115 mg/dL — ABNORMAL HIGH (ref 70–99)
Potassium: 3.9 mEq/L (ref 3.5–5.1)
Sodium: 138 mEq/L (ref 135–145)

## 2013-05-23 LAB — PRO B NATRIURETIC PEPTIDE: Pro B Natriuretic peptide (BNP): 49.4 pg/mL (ref 0–125)

## 2013-05-23 MED ORDER — ASPIRIN 81 MG PO CHEW
324.0000 mg | CHEWABLE_TABLET | Freq: Once | ORAL | Status: AC
  Administered 2013-05-23: 324 mg via ORAL
  Filled 2013-05-23: qty 4

## 2013-05-23 NOTE — ED Provider Notes (Signed)
[name] is a 42 y.o. male who complains of transient symptoms for several days, that are recurrent. He was evaluated here 5 days ago for similar problems. The primary issue today is a sensation of shortness of breath; that came on without cause. It is largely gone now. He had transient chest pain, today, and has had some left arm pain in the last several days that comes and goes. Sometimes he feels tingling in his left hand. He denies any weakness, dizziness, nausea, vomiting, fever, or chills. He is concerned about changing a job at work and is "trying to get a promotion". He relates typical marital arguments with his wife. She thinks that he has an anxiety problem. He has family members with MI at a young age. He has never been evaluated for cardiac disease. Exam alert, cooperative; he is obese. Heart regular rate and rhythm. No murmur. Lungs clear to auscultation. Neurologic is nonfocal  Assessment: Nonspecific shortness of breath and chest pain. Doubt ACS, PE, or pneumonia. He is stable for discharge with outpatient management  Plan: Follow up with cardiology for outpatient stress test. Have encouraged him to get a PCP, for ongoing management.  Medical screening examination/treatment/procedure(s) were conducted as a shared visit with non-physician practitioner(s) and myself.  I personally evaluated the patient during the encounter  Flint Melter, MD 05/24/13 [phone]

## 2013-05-23 NOTE — ED Notes (Signed)
Pt complains of central chest pain lasting approx 30 minutes. Pt reports shortness of breath with chest pain.

## 2013-05-23 NOTE — ED Provider Notes (Signed)
CSN: [id number]     Arrival date & time 05/23/13  1546 History     First MD Initiated Contact with Patient 05/23/13 1646     Chief Complaint  Patient presents with  . Chest Pain   (Consider location/radiation/quality/duration/timing/severity/associated sxs/prior Treatment) HPI Comments: Patient is a 42 year old male with a past medical history of sleep apnea and noncompliance with CPAP, obesity and anginal chest pain who presents with chest pain that started about 30 minutes prior to arrival. Symptoms started gradually and progressively worsened since the onset. The pain is located in his central chest without radiation. The pain is dull and severe. Associated SOB and some nausea. No aggravating/alleviating factors. Patient has continued to have pain, making the pain continuous for the past few hours. Patient has not tried anything for pain relief. Patient denies any recent travel, recent surgery, previous DVT/PE.  Patient is a 42 y.o. male presenting with chest pain.  Chest Pain Associated symptoms: shortness of breath     Past Medical History  Diagnosis Date  . Anginal pain     04/2011 pt had agina, seen by cardiologist, no further treatment   Past Surgical History  Procedure Laterality Date  . Tonsillectomy     Family History  Problem Relation Age of Onset  . Diabetes Mother   . Hypertension Mother   . Hypertension Father   . Diabetes Father    History  Substance Use Topics  . Smoking status: Light Tobacco Smoker    Types: Cigars  . Smokeless tobacco: Never Used  . Alcohol Use: Yes     Comment: once a month    Review of Systems  Respiratory: Positive for shortness of breath.   Cardiovascular: Positive for chest pain.  All other systems reviewed and are negative.    Allergies  Review of patient's allergies indicates no known allergies.  Home Medications   Current Outpatient Rx  Name  Route  Sig  Dispense  Refill  . aspirin 81 MG chewable tablet   Oral  Chew 81 mg by mouth daily.         Marland Kitchen ibuprofen (ADVIL,MOTRIN) 200 MG tablet   Oral   Take 800 mg by mouth every 8 (eight) hours as needed for pain.          . Multiple Vitamin (MULTIVITAMIN WITH MINERALS) TABS   Oral   Take 1 tablet by mouth daily.          BP 136/61  Pulse 88  Temp(Src) 98 F (36.7 C) (Oral)  Resp 24  SpO2 95% Physical Exam  Nursing note and vitals reviewed. Constitutional: He is oriented to person, place, and time. He appears well-developed and well-nourished. No distress.  HENT:  Head: Normocephalic and atraumatic.  Eyes: Conjunctivae are normal.  Neck: Normal range of motion.  Cardiovascular: Normal rate, regular rhythm and intact distal pulses.  Exam reveals no gallop and no friction rub.   No murmur heard. Pulmonary/Chest: Effort normal and breath sounds normal. He has no wheezes. He has no rales. He exhibits no tenderness.  Abdominal: Soft. He exhibits no distension. There is no tenderness. There is no rebound and no guarding.  Musculoskeletal: Normal range of motion.  No lower extremity edema or calf tenderness to palpation.   Neurological: He is alert and oriented to person, place, and time. Coordination normal.  Speech is goal-oriented. Moves limbs without ataxia.   Skin: Skin is warm and dry.  Psychiatric: He has a normal mood and affect.  His behavior is normal.    ED Course   Procedures (including critical care time)   Date: 05/23/2013  Rate: 78  Rhythm: normal sinus rhythm  QRS Axis: normal  Intervals: normal  ST/T Wave abnormalities: nonspecific T wave changes  Conduction Disutrbances:none  Narrative Interpretation: NSR with nonspecific T wave changes unchanged from previous  Old EKG Reviewed: unchanged   Labs Reviewed  BASIC METABOLIC PANEL - Abnormal; Notable for the following:    Glucose, Bld 115 (*)    GFR calc non Af Amer 71 (*)    GFR calc Af Amer 82 (*)    All other components within normal limits  TROPONIN I  CBC  WITH DIFFERENTIAL  PRO B NATRIURETIC PEPTIDE   Dg Chest 2 View  05/23/2013   *RADIOLOGY REPORT*  Clinical Data: History of chest pain.  History of smoking.  CHEST - 2 VIEW  Comparison: 05/18/2013.  Findings: Cardiac silhouette is normal size and shape.  Mediastinal and hilar contours appear stable.  No peripheral infiltrate or consolidation is seen.  There is minimal central peribronchial thickening. No pleural abnormality is evident. Bones appear average for age.  IMPRESSION: Minimal central peribronchial thickening.  This may be associated with bronchitis, asthma, and reactive airway disease.  No consolidation or pleural effusion evident.   Original Report Authenticated By: Onalee Hua Call   1. Chest pain     MDM  5:28 PM Labs unremarkable for acute changes. Chest xray unchanged from 5 days ago. Troponin negative. No EKG changes. Patient is PERC negative. Vitals stable and patient afebrile.   6:04 PM Dr. Effie Shy saw the patient who thinks the patient can go home. Patient will have a follow up appointment with [facility]. Patient instructed to return with worsening or concerning symptoms. Vitals stable and patient afebrile. No further evaluation needed at this time.       Emilia Beck, PA-C 05/23/13 1810

## 2013-12-30 ENCOUNTER — Encounter (HOSPITAL_COMMUNITY): Payer: Self-pay | Admitting: Emergency Medicine

## 2013-12-30 ENCOUNTER — Emergency Department (HOSPITAL_COMMUNITY)
Admission: EM | Admit: 2013-12-30 | Discharge: 2013-12-30 | Disposition: A | Discharge status: Home / Self Care | Payer: Medicaid Other | Attending: Emergency Medicine | Admitting: Emergency Medicine

## 2013-12-30 ENCOUNTER — Emergency Department (HOSPITAL_COMMUNITY): Payer: Medicaid Other

## 2013-12-30 DIAGNOSIS — R079 Chest pain, unspecified: Secondary | ICD-10-CM

## 2013-12-30 DIAGNOSIS — R0600 Dyspnea, unspecified: Secondary | ICD-10-CM

## 2013-12-30 DIAGNOSIS — R0609 Other forms of dyspnea: Secondary | ICD-10-CM | POA: Insufficient documentation

## 2013-12-30 DIAGNOSIS — Z7982 Long term (current) use of aspirin: Secondary | ICD-10-CM | POA: Insufficient documentation

## 2013-12-30 DIAGNOSIS — R42 Dizziness and giddiness: Secondary | ICD-10-CM | POA: Insufficient documentation

## 2013-12-30 DIAGNOSIS — R0789 Other chest pain: Secondary | ICD-10-CM | POA: Insufficient documentation

## 2013-12-30 DIAGNOSIS — Z79899 Other long term (current) drug therapy: Secondary | ICD-10-CM | POA: Insufficient documentation

## 2013-12-30 DIAGNOSIS — R630 Anorexia: Secondary | ICD-10-CM | POA: Insufficient documentation

## 2013-12-30 DIAGNOSIS — F17200 Nicotine dependence, unspecified, uncomplicated: Secondary | ICD-10-CM | POA: Insufficient documentation

## 2013-12-30 DIAGNOSIS — R0989 Other specified symptoms and signs involving the circulatory and respiratory systems: Secondary | ICD-10-CM | POA: Insufficient documentation

## 2013-12-30 LAB — TROPONIN I: Troponin I: <0.3 ng/mL (ref <0.30)

## 2013-12-30 LAB — D-DIMER, QUANTITATIVE: D-Dimer, Quant: 0.28 ug/mL-FEU (ref 0.00–0.48)

## 2013-12-30 LAB — CBC
HCT: 42.1 % (ref 39.0–52.0)
Hemoglobin: 14.3 g/dL (ref 13.0–17.0)
MCH: 28.6 pg (ref 26.0–34.0)
MCHC: 34 g/dL (ref 30.0–36.0)
MCV: 84.2 fL (ref 78.0–100.0)
Platelets: 285 10*3/uL (ref 150–400)
RBC: 5 MIL/uL (ref 4.22–5.81)
RDW: 14.4 % (ref 11.5–15.5)
WBC: 7 10*3/uL (ref 4.0–10.5)

## 2013-12-30 LAB — BASIC METABOLIC PANEL
BUN: 12 mg/dL (ref 6–23)
CO2: 26 mEq/L (ref 19–32)
Calcium: 9.5 mg/dL (ref 8.4–10.5)
Chloride: 98 mEq/L (ref 96–112)
Creatinine, Ser: 1.02 mg/dL (ref 0.50–1.35)
GFR calc Af Amer: >90 mL/min (ref >90)
GFR calc non Af Amer: 88 mL/min — ABNORMAL LOW (ref >90)
Glucose, Bld: 93 mg/dL (ref 70–99)
Potassium: 4.2 mEq/L (ref 3.7–5.3)
Sodium: 136 mEq/L — ABNORMAL LOW (ref 137–147)

## 2013-12-30 LAB — I-STAT TROPONIN, ED: Troponin i, poc: 0 ng/mL (ref 0.00–0.08)

## 2013-12-30 LAB — PRO B NATRIURETIC PEPTIDE: Pro B Natriuretic peptide (BNP): 24.9 pg/mL (ref 0–125)

## 2013-12-30 MED ORDER — ASPIRIN 81 MG PO CHEW
162.0000 mg | CHEWABLE_TABLET | Freq: Once | ORAL | Status: AC
  Administered 2013-12-30: 162 mg via ORAL
  Filled 2013-12-30: qty 2

## 2013-12-30 MED ORDER — SODIUM CHLORIDE 0.9 % IV BOLUS (SEPSIS)
500.0000 mL | Freq: Once | INTRAVENOUS | Status: AC
  Administered 2013-12-30: 500 mL via INTRAVENOUS

## 2013-12-30 NOTE — Discharge Instructions (Signed)
Continue aspirin daily. Discuss stress test and heart ultrasound with your doctor and cardiologist. If you were given medicines take as directed.  If you are on coumadin or contraceptives realize their levels and effectiveness is altered by many different medicines.  If you have any reaction (rash, tongues swelling, other) to the medicines stop taking and see a physician.   Please follow up as directed and return to the ER or see a physician for new or worsening symptoms.  Thank you.

## 2013-12-30 NOTE — ED Provider Notes (Signed)
CSN: [id number]     Arrival date & time 12/30/13  1600 History   First MD Initiated Contact with Patient 12/30/13 1714     Chief Complaint  Patient presents with  . Shortness of Breath  . Headache  . Chest Pain     (Consider location/radiation/quality/duration/timing/severity/associated sxs/prior Treatment) HPI Comments: 43 yo male with preDM, obesity, high cholesterol presents with headache, chest ache and sob.  Pt has had generalized, gradual onset HA since he fell and hit his head 8 days ago, he felt lightheaded prior.  No hx of bleeding issues, pt on asa.  He has had fairly constant chest ache like a "knot" anterior chest, non radiating.  He has had exertional sob the past few days as well, mild orthopnea, mild leg swelling bilateral.  Patient denies blood clot history, active cancer, recent major trauma or surgery, unilateral leg swelling/ pain, recent long travel, hemoptysis or oral contraceptives. No cardiac hx.  No FH cardiac. No diaphoresis.   Patient is a 43 y.o. male presenting with shortness of breath, headaches, and chest pain. The history is provided by the patient.  Shortness of Breath Associated symptoms: chest pain and headaches   Associated symptoms: no abdominal pain, no cough, no fever, no neck pain, no rash and no vomiting   Headache Associated symptoms: no abdominal pain, no back pain, no congestion, no cough, no fever, no neck pain, no neck stiffness and no vomiting   Chest Pain Associated symptoms: headache and shortness of breath   Associated symptoms: no abdominal pain, no back pain, no cough, no fever and not vomiting     Past Medical History  Diagnosis Date  . Anginal pain     04/2011 pt had agina, seen by cardiologist, no further treatment   Past Surgical History  Procedure Laterality Date  . Tonsillectomy     Family History  Problem Relation Age of Onset  . Diabetes Mother   . Hypertension Mother   . Hypertension Father   . Diabetes Father     History  Substance Use Topics  . Smoking status: Light Tobacco Smoker    Types: Cigars  . Smokeless tobacco: Never Used  . Alcohol Use: Yes     Comment: once a month    Review of Systems  Constitutional: Positive for appetite change. Negative for fever and chills.  HENT: Negative for congestion.   Eyes: Negative for visual disturbance.  Respiratory: Positive for shortness of breath. Negative for cough.   Cardiovascular: Positive for chest pain.  Gastrointestinal: Negative for vomiting and abdominal pain.  Genitourinary: Negative for dysuria and flank pain.  Musculoskeletal: Negative for back pain, neck pain and neck stiffness.  Skin: Negative for rash.  Neurological: Positive for light-headedness and headaches.      Allergies  Review of patient's allergies indicates no known allergies.  Home Medications   Current Outpatient Rx  Name  Route  Sig  Dispense  Refill  . aspirin 81 MG chewable tablet   Oral   Chew 81 mg by mouth daily.         Marland Kitchen aspirin-acetaminophen-caffeine (EXCEDRIN MIGRAINE) 250-250-65 MG per tablet   Oral   Take 1 tablet by mouth every 6 (six) hours as needed for headache.         . ibuprofen (ADVIL,MOTRIN) 200 MG tablet   Oral   Take 800 mg by mouth every 8 (eight) hours as needed for pain.          . Multiple Vitamin (  MULTIVITAMIN WITH MINERALS) TABS   Oral   Take 1 tablet by mouth daily.          BP 145/77  Pulse 88  Temp(Src) 98.7 F (37.1 C) (Oral)  Resp 18  SpO2 96% Physical Exam  Nursing note and vitals reviewed. Constitutional: He is oriented to person, place, and time. He appears well-developed and well-nourished.  HENT:  Head: Normocephalic and atraumatic.  Eyes: Conjunctivae are normal. Right eye exhibits no discharge. Left eye exhibits no discharge.  Neck: Normal range of motion. Neck supple. No tracheal deviation present.  Cardiovascular: Normal rate and regular rhythm.   Pulmonary/Chest: Effort normal and breath  sounds normal.  Abdominal: Soft. He exhibits no distension. There is no tenderness. There is no guarding.  Musculoskeletal: He exhibits no edema.  Neurological: He is alert and oriented to person, place, and time. He has normal strength. No cranial nerve deficit or sensory deficit. Coordination normal. GCS eye subscore is 4. GCS verbal subscore is 5. GCS motor subscore is 6.  Skin: Skin is warm. No rash noted.  Psychiatric: He has a normal mood and affect.    ED Course  Procedures (including critical care time) Labs Review Labs Reviewed  BASIC METABOLIC PANEL - Abnormal; Notable for the following:    Sodium 136 (*)    GFR calc non Af Amer 88 (*)    All other components within normal limits  CBC  D-DIMER, QUANTITATIVE  PRO B NATRIURETIC PEPTIDE  TROPONIN I  I-STAT TROPOININ, ED   Imaging Review Dg Chest 2 View  12/30/2013   CLINICAL DATA:  Shortness of breath with headache and chest pain.  EXAM: CHEST  2 VIEW  COMPARISON:  DG CHEST 2 VIEW dated 05/23/2013; DG CHEST 2 VIEW dated 05/18/2013  FINDINGS: The heart size and mediastinal contours are stable. The lungs are clear. There may be mild chronic central airway thickening as previously described. There is no hyperinflation, pleural effusion or pneumothorax. The osseous structures appear normal.  IMPRESSION: No acute cardiopulmonary process. Possible chronic central airway thickening.   Electronically Signed   By: Roxy HorsemanBill  Veazey M.D.   On: 12/30/2013 17:53     EKG Interpretation   Date/Time:  Monday December 30 2013 16:55:15 EST Ventricular Rate:  84 PR Interval:  147 QRS Duration: 103 QT Interval:  380 QTC Calculation: 449 R Axis:   46 Text Interpretation:  Sinus rhythm No acute findings Similar to previous  Confirmed by Enaya Howze  MD, Timoteo Carreiro ([id number]) on 12/30/2013 6:00:59 PM      MDM   Final diagnoses:  Dyspnea  Chest pain  Concussion  Pt with two cardiac risk factors, no PE risk factors.  Overall with age low risk for both.  No sxs  of dissection.   HA likely concussion, will CT head.  SOB cardiac/ blood clot/ CHF work up. ASA given.  Pt has no cp on recheck.  Delta troponin neg.  D dimer neg, low risk. Pt feels fine and comfortable with close outpt fup cardio/ pcp for stress test.  Results and differential diagnosis were discussed with the patient. Close follow up outpatient was discussed, patient comfortable with the plan.      Enid SkeensJoshua M Chrysa Rampy, MD 12/30/13 2033

## 2013-12-30 NOTE — ED Notes (Signed)
Patient c/o feeling dizzy.

## 2013-12-30 NOTE — ED Notes (Signed)
Pt presents with c/o shortness of breath, states onset was 7 days ago when hit his head on the wall, reports LOC after that incidence, throbbing headaches which got worse today with symptoms of nausea, and dizziness. Pt has no apparent signs of distress.

## 2015-01-12 ENCOUNTER — Ambulatory Visit (HOSPITAL_BASED_OUTPATIENT_CLINIC_OR_DEPARTMENT_OTHER): Payer: Medicaid Other | Attending: Family Medicine

## 2015-01-12 VITALS — Ht 65.0 in | Wt 285.0 lb

## 2015-01-12 DIAGNOSIS — G4733 Obstructive sleep apnea (adult) (pediatric): Secondary | ICD-10-CM | POA: Diagnosis present

## 2015-01-17 ENCOUNTER — Ambulatory Visit (HOSPITAL_BASED_OUTPATIENT_CLINIC_OR_DEPARTMENT_OTHER): Payer: Medicaid Other | Admitting: Internal Medicine

## 2015-01-17 DIAGNOSIS — G4733 Obstructive sleep apnea (adult) (pediatric): Secondary | ICD-10-CM

## 2015-01-17 NOTE — Sleep Study (Signed)
   NAME: Duane PalauRonald M Rinks DATE OF BIRTH:  05/21/1971 MEDICAL RECORD NUMBER [id number]  LOCATION: [facility]  PHYSICIAN: Sundee Garland D  DATE OF STUDY: 01/12/2015  SLEEP STUDY TYPE: Nocturnal Polysomnogram               REFERRING PHYSICIAN: Leilani Ableeese, Betti, MD  INDICATION FOR STUDY: Hypersomnia with sleep apnea  EPWORTH SLEEPINESS SCORE:   4/24 HEIGHT: 5\' 5"  (165.1 cm)  WEIGHT: 285 lb (129.275 kg)    Body mass index is 47.43 kg/(m^2).  NECK SIZE: 20 in.  MEDICATIONS: Charted for review  SLEEP ARCHITECTURE: Total sleep time 199 minutes with sleep efficiency 49.3%. Stage I was 18.6%, stage II 70.9%, stage III absent, REM 10.6% of total sleep time. Sleep latency 80 minutes, REM latency 220 minutes, awake after sleep onset 114 minutes, arousal index 38.3, bedtime medication: None. Sleep was fragmented by repeated wakings throughout the night.  RESPIRATORY DATA: Apnea hypopnea index (AHI) 65.4 per hour. 216 total events scored including 164 obstructive apneas, 41 central apneas, 2 mixed apneas, 9 hypopneas. Events were more common while nonsupine. REM AHI 34.3 per hour. Because of frequent awakenings, there were not enough early respiratory events to meet protocol requirements for split CPAP titration on this study night.  OXYGEN DATA: Loud snoring with oxygen desaturation to a nadir of 89% and mean saturation 97.1% on room air.  CARDIAC DATA: Sinus rhythm with PVCs  MOVEMENT/PARASOMNIA: A total of 9 limb jerks were counted of which 2 were associated with arousal or awakening for periodic limb movement with arousal index of 0.6 per hour. 1 bathroom trips. Equipment problems required tech adjustment which briefly disturbed the patient.  IMPRESSION/ RECOMMENDATION:   1) Severe obstructive sleep apnea/hypopnea syndrome, AHI 65.4 per hour with events in all sleep positions. REM AHI 34.3 per hour. Loud snoring with oxygen desaturation to a nadir of 89% and mean saturation  97.1% on room air. 2) Sleep was markedly fragmented by repeated wakings associated with respiratory events . He did not have enough early respiratory events to meet protocol requirements for split protocol CPAP titration. The patient can return for dedicated CPAP titration if appropriate.   Waymon BudgeYOUNG,Joannah Gitlin D Diplomate, American Board of Sleep Medicine  ELECTRONICALLY SIGNED ON:  01/17/2015, 9:54 AM [facility] PH: [phone]   FX: [phone] ACCREDITED BY THE AMERICAN ACADEMY OF SLEEP MEDICINE

## 2015-01-21 ENCOUNTER — Ambulatory Visit (INDEPENDENT_AMBULATORY_CARE_PROVIDER_SITE_OTHER): Payer: Medicaid Other | Admitting: Podiatry

## 2015-01-21 ENCOUNTER — Encounter: Payer: Self-pay | Admitting: Podiatry

## 2015-01-21 ENCOUNTER — Ambulatory Visit (INDEPENDENT_AMBULATORY_CARE_PROVIDER_SITE_OTHER): Payer: Medicaid Other

## 2015-01-21 VITALS — BP 124/69 | HR 92

## 2015-01-21 DIAGNOSIS — R52 Pain, unspecified: Secondary | ICD-10-CM

## 2015-01-21 DIAGNOSIS — M722 Plantar fascial fibromatosis: Secondary | ICD-10-CM

## 2015-01-21 DIAGNOSIS — B351 Tinea unguium: Secondary | ICD-10-CM | POA: Diagnosis not present

## 2015-01-21 MED ORDER — TRIAMCINOLONE ACETONIDE 10 MG/ML IJ SUSP
10.0000 mg | Freq: Once | INTRAMUSCULAR | Status: AC
  Administered 2015-01-21: 10 mg

## 2015-01-21 NOTE — Progress Notes (Signed)
   Subjective:    Patient ID: [name], male    DOB: 10/15/1971, 44 y.o.   MRN: [id number]  HPI 44 year old male presents the office today with complaints of bilateral heel pain with a right greater than left. He states that he has pain in the morning or after periods of rest with ambulation. Upon ambulation the pain does resolve. He states this been ongoing for several months. Denies any history of injury or trauma to the area or any change or increase in activity the time of onset of symptoms. He denies any swelling or redness overlying the area. Denies any numbness or tingling. He said no prior treatment. He also states that he has nail fungus for which he like treatment for. He is tried over-the-counter treatments without any resolution. [place] will [place] to [place] within the next couple weeks. No other complaints at this time.   Review of Systems  All other systems reviewed and are negative.      Objective:   Physical Exam AAO 3, NAD  DP/PT pulses palpable, CRT less than 3 seconds  Protective sensation intact with Simms Weinstein monofilament, vibratory sensation intact, Achilles tendon reflex intact. Tinel sign negative. Tenderness to palpation of bilateral plantar heels on the insertion of the plantar fascia on the plantar medial tubercle of the calcaneus with a right greater than left. There is no pain on the course of plantar fascial within the arch of the foot and the ligament appears to be intact. There is no pain along the posterior aspect of the calcaneus along the course/insertion of the Achilles tendon. No pain with lateral compression of the calcaneus or pain with vibratory sensation. There is no overlying edema, erythema, increase in warmth. MMT 5/5, ROM WNL Nails are hypertrophic, dystrophic, elongated, brittle, discolored. There is no surrounding erythema or drainage or nail sites. There is no tenderness palpation about the nails. No open lesions or pre-ulcerative  lesions. No interdigital maceration. No pain with calf compression, swelling, warmth, erythema.       Assessment & Plan:   44 year old male with bilateral heel pain right greater than left.  -X-rays were obtained and reviewed with the patient.  -Treatment options were discussed the patient including alternatives, risks, complications.  -Patient elects to proceed with steroid injection into the right heel. Under sterile skin preparation, a total of 2.5cc of kenalog 10, 0.5% Marcaine plain, and 2% lidocaine plain were infiltrated into the symptomatic area without complication. A band-aid was applied. Patient tolerated the injection well without complication. Post-injection care with discussed with the patient. Discussed with the patient to ice the area over the next couple of days to help prevent a steroid flare.  -Discussed stretching exercises. -Ice to the area. -Discussed shoe gear modifications. Also do not go barefoot in the house and wear supportive shoe at all times. -Discussed orthotics. -[facility] He would like Lamisil treatment. We'll initiate treatment once the biopsy results are obtained. -Follow-up in 3 weeks or sooner if any problems are to arise. In the meantime encouraged to call the office with any questions, concerns, change in symptoms. He may be moving to [place] for the next appointment if he does discussed follow-up with a podiatrist when he moves.

## 2015-01-21 NOTE — Patient Instructions (Signed)
Plantar Fasciitis (Heel Spur Syndrome) with Rehab The plantar fascia is a fibrous, ligament-like, soft-tissue structure that spans the bottom of the foot. Plantar fasciitis is a condition that causes pain in the foot due to inflammation of the tissue. SYMPTOMS   Pain and tenderness on the underneath side of the foot.  Pain that worsens with standing or walking. CAUSES  Plantar fasciitis is caused by irritation and injury to the plantar fascia on the underneath side of the foot. Common mechanisms of injury include:  Direct trauma to bottom of the foot.  Damage to a small nerve that runs under the foot where the main fascia attaches to the heel bone.  Stress placed on the plantar fascia due to bone spurs. RISK INCREASES WITH:   Activities that place stress on the plantar fascia (running, jumping, pivoting, or cutting).  Poor strength and flexibility.  Improperly fitted shoes.  Tight calf muscles.  Flat feet.  Failure to warm-up properly before activity.  Obesity. PREVENTION  Warm up and stretch properly before activity.  Allow for adequate recovery between workouts.  Maintain physical fitness:  Strength, flexibility, and endurance.  Cardiovascular fitness.  Maintain a health body weight.  Avoid stress on the plantar fascia.  Wear properly fitted shoes, including arch supports for individuals who have flat feet. PROGNOSIS  If treated properly, then the symptoms of plantar fasciitis usually resolve without surgery. However, occasionally surgery is necessary. RELATED COMPLICATIONS   Recurrent symptoms that may result in a chronic condition.  Problems of the lower back that are caused by compensating for the injury, such as limping.  Pain or weakness of the foot during push-off following surgery.  Chronic inflammation, scarring, and partial or complete fascia tear, occurring more often from repeated injections. TREATMENT  Treatment initially involves the use of  ice and medication to help reduce pain and inflammation. The use of strengthening and stretching exercises may help reduce pain with activity, especially stretches of the Achilles tendon. These exercises may be performed at home or with a therapist. Your caregiver may recommend that you use heel cups of arch supports to help reduce stress on the plantar fascia. Occasionally, corticosteroid injections are given to reduce inflammation. If symptoms persist for greater than 6 months despite non-surgical (conservative), then surgery may be recommended.  MEDICATION   If pain medication is necessary, then nonsteroidal anti-inflammatory medications, such as aspirin and ibuprofen, or other minor pain relievers, such as acetaminophen, are often recommended.  Do not take pain medication within 7 days before surgery.  Prescription pain relievers may be given if deemed necessary by your caregiver. Use only as directed and only as much as you need.  Corticosteroid injections may be given by your caregiver. These injections should be reserved for the most serious cases, because they may only be given a certain number of times. HEAT AND COLD  Cold treatment (icing) relieves pain and reduces inflammation. Cold treatment should be applied for 10 to 15 minutes every 2 to 3 hours for inflammation and pain and immediately after any activity that aggravates your symptoms. Use ice packs or massage the area with a piece of ice (ice massage).  Heat treatment may be used prior to performing the stretching and strengthening activities prescribed by your caregiver, physical therapist, or athletic trainer. Use a heat pack or soak the injury in warm water. SEEK IMMEDIATE MEDICAL CARE IF:  Treatment seems to offer no benefit, or the condition worsens.  Any medications produce adverse side effects. EXERCISES RANGE   OF MOTION (ROM) AND STRETCHING EXERCISES - Plantar Fasciitis (Heel Spur Syndrome) These exercises may help you  when beginning to rehabilitate your injury. Your symptoms may resolve with or without further involvement from your physician, physical therapist or athletic trainer. While completing these exercises, remember:   Restoring tissue flexibility helps normal motion to return to the joints. This allows healthier, less painful movement and activity.  An effective stretch should be held for at least 30 seconds.  A stretch should never be painful. You should only feel a gentle lengthening or release in the stretched tissue. RANGE OF MOTION - Toe Extension, Flexion  Sit with your right / left leg crossed over your opposite knee.  Grasp your toes and gently pull them back toward the top of your foot. You should feel a stretch on the bottom of your toes and/or foot.  Hold this stretch for __________ seconds.  Now, gently pull your toes toward the bottom of your foot. You should feel a stretch on the top of your toes and or foot.  Hold this stretch for __________ seconds. Repeat __________ times. Complete this stretch __________ times per day.  RANGE OF MOTION - Ankle Dorsiflexion, Active Assisted  Remove shoes and sit on a chair that is preferably not on a carpeted surface.  Place right / left foot under knee. Extend your opposite leg for support.  Keeping your heel down, slide your right / left foot back toward the chair until you feel a stretch at your ankle or calf. If you do not feel a stretch, slide your bottom forward to the edge of the chair, while still keeping your heel down.  Hold this stretch for __________ seconds. Repeat __________ times. Complete this stretch __________ times per day.  STRETCH - Gastroc, Standing  Place hands on wall.  Extend right / left leg, keeping the front knee somewhat bent.  Slightly point your toes inward on your back foot.  Keeping your right / left heel on the floor and your knee straight, shift your weight toward the wall, not allowing your back to  arch.  You should feel a gentle stretch in the right / left calf. Hold this position for __________ seconds. Repeat __________ times. Complete this stretch __________ times per day. STRETCH - Soleus, Standing  Place hands on wall.  Extend right / left leg, keeping the other knee somewhat bent.  Slightly point your toes inward on your back foot.  Keep your right / left heel on the floor, bend your back knee, and slightly shift your weight over the back leg so that you feel a gentle stretch deep in your back calf.  Hold this position for __________ seconds. Repeat __________ times. Complete this stretch __________ times per day. STRETCH - Gastrocsoleus, Standing  Note: This exercise can place a lot of stress on your foot and ankle. Please complete this exercise only if specifically instructed by your caregiver.   Place the ball of your right / left foot on a step, keeping your other foot firmly on the same step.  Hold on to the wall or a rail for balance.  Slowly lift your other foot, allowing your body weight to press your heel down over the edge of the step.  You should feel a stretch in your right / left calf.  Hold this position for __________ seconds.  Repeat this exercise with a slight bend in your right / left knee. Repeat __________ times. Complete this stretch __________ times per day.    STRENGTHENING EXERCISES - Plantar Fasciitis (Heel Spur Syndrome)  These exercises may help you when beginning to rehabilitate your injury. They may resolve your symptoms with or without further involvement from your physician, physical therapist or athletic trainer. While completing these exercises, remember:   Muscles can gain both the endurance and the strength needed for everyday activities through controlled exercises.  Complete these exercises as instructed by your physician, physical therapist or athletic trainer. Progress the resistance and repetitions only as guided. STRENGTH -  Towel Curls  Sit in a chair positioned on a non-carpeted surface.  Place your foot on a towel, keeping your heel on the floor.  Pull the towel toward your heel by only curling your toes. Keep your heel on the floor.  If instructed by your physician, physical therapist or athletic trainer, add ____________________ at the end of the towel. Repeat __________ times. Complete this exercise __________ times per day. STRENGTH - Ankle Inversion  Secure one end of a rubber exercise band/tubing to a fixed object (table, pole). Loop the other end around your foot just before your toes.  Place your fists between your knees. This will focus your strengthening at your ankle.  Slowly, pull your big toe up and in, making sure the band/tubing is positioned to resist the entire motion.  Hold this position for __________ seconds.  Have your muscles resist the band/tubing as it slowly pulls your foot back to the starting position. Repeat __________ times. Complete this exercises __________ times per day.  Document Released: 10/17/2005 Document Revised: 01/09/2012 Document Reviewed: 01/29/2009 [employer] Patient Information 2015 [employer]. This information is not intended to replace advice given to you by your health care provider. Make sure you discuss any questions you have with your health care provider.  

## 2015-01-25 ENCOUNTER — Encounter: Payer: Self-pay | Admitting: Podiatry

## 2015-02-10 ENCOUNTER — Telehealth: Payer: Self-pay | Admitting: *Deleted

## 2015-02-10 NOTE — Telephone Encounter (Signed)
Pt called request fungal toenail result collected on 01/21/2015.  I informed pt it could take up to 4-6 weeks for results returned, and we would call with the results and instructions.

## 2015-02-11 ENCOUNTER — Ambulatory Visit: Payer: Medicaid Other | Admitting: Podiatry

## 2015-02-16 ENCOUNTER — Telehealth: Payer: Self-pay | Admitting: Podiatry

## 2015-02-16 ENCOUNTER — Encounter: Payer: Self-pay | Admitting: Podiatry

## 2015-02-16 NOTE — Telephone Encounter (Signed)
Called patient to schedule an appointment to come in to go over pathology results. Patient asked if he could be called and given the results over the phone because he has moved and is in the process of moving to [place]. I told him I would send a message to our nurse and to Dr. Ardelle AntonWagoner and someone would give him a call if not by the end of today than tomorrow.

## 2015-02-16 NOTE — Telephone Encounter (Signed)
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 He can do penlac or lamisil as his insurance will not cover the others. If he wants lamisil he needs to follow up with someone in [place] to manage it. We can also just mail him the results to have so he can follow-up with someone where he moves to. Thanks.

## 2015-02-17 NOTE — Telephone Encounter (Signed)
Contacted pt and informed of Dr. Gabriel RungWagoner's recommendation and pt chose to be treated in [place] and would like the results sent [place].  Done.

## 2015-03-10 ENCOUNTER — Encounter (HOSPITAL_BASED_OUTPATIENT_CLINIC_OR_DEPARTMENT_OTHER): Payer: Medicaid Other

## 2015-08-21 ENCOUNTER — Emergency Department (EMERGENCY_DEPARTMENT_HOSPITAL): Admit: 2015-08-21 | Discharge: 2015-08-21 | Disposition: A | Discharge status: Home / Self Care | Payer: Medicaid Other

## 2015-08-21 ENCOUNTER — Encounter (HOSPITAL_COMMUNITY): Payer: Self-pay | Admitting: Emergency Medicine

## 2015-08-21 ENCOUNTER — Emergency Department (HOSPITAL_COMMUNITY)
Admission: EM | Admit: 2015-08-21 | Discharge: 2015-08-21 | Disposition: A | Discharge status: Home / Self Care | Payer: Medicaid Other | Attending: Emergency Medicine | Admitting: Emergency Medicine

## 2015-08-21 DIAGNOSIS — Z79899 Other long term (current) drug therapy: Secondary | ICD-10-CM | POA: Insufficient documentation

## 2015-08-21 DIAGNOSIS — Z720 Tobacco use: Secondary | ICD-10-CM | POA: Insufficient documentation

## 2015-08-21 DIAGNOSIS — M79609 Pain in unspecified limb: Secondary | ICD-10-CM | POA: Diagnosis not present

## 2015-08-21 DIAGNOSIS — Z7982 Long term (current) use of aspirin: Secondary | ICD-10-CM | POA: Insufficient documentation

## 2015-08-21 DIAGNOSIS — M79602 Pain in left arm: Secondary | ICD-10-CM

## 2015-08-21 DIAGNOSIS — I209 Angina pectoris, unspecified: Secondary | ICD-10-CM | POA: Insufficient documentation

## 2015-08-21 LAB — I-STAT CHEM 8, ED
BUN: 13 mg/dL (ref 6–20)
Calcium, Ion: 1.17 mmol/L (ref 1.12–1.23)
Chloride: 103 mmol/L (ref 101–111)
Creatinine, Ser: 1 mg/dL (ref 0.61–1.24)
Glucose, Bld: 119 mg/dL — ABNORMAL HIGH (ref 65–99)
HCT: 46 % (ref 39.0–52.0)
Hemoglobin: 15.6 g/dL (ref 13.0–17.0)
Potassium: 3.8 mmol/L (ref 3.5–5.1)
Sodium: 142 mmol/L (ref 135–145)
TCO2: 23 mmol/L (ref 0–100)

## 2015-08-21 LAB — CBC WITH DIFFERENTIAL/PLATELET
Basophils Absolute: 0 10*3/uL (ref 0.0–0.1)
Basophils Relative: 0 %
Eosinophils Absolute: 0.1 10*3/uL (ref 0.0–0.7)
Eosinophils Relative: 1 %
HCT: 43.5 % (ref 39.0–52.0)
Hemoglobin: 14.4 g/dL (ref 13.0–17.0)
Lymphocytes Relative: 26 %
Lymphs Abs: 1.9 10*3/uL (ref 0.7–4.0)
MCH: 28 pg (ref 26.0–34.0)
MCHC: 33.1 g/dL (ref 30.0–36.0)
MCV: 84.5 fL (ref 78.0–100.0)
Monocytes Absolute: 0.6 10*3/uL (ref 0.1–1.0)
Monocytes Relative: 8 %
Neutro Abs: 4.8 10*3/uL (ref 1.7–7.7)
Neutrophils Relative %: 65 %
Platelets: 298 10*3/uL (ref 150–400)
RBC: 5.15 MIL/uL (ref 4.22–5.81)
RDW: 14.3 % (ref 11.5–15.5)
WBC: 7.4 10*3/uL (ref 4.0–10.5)

## 2015-08-21 MED ORDER — IBUPROFEN 800 MG PO TABS: 800.0000 mg | ORAL_TABLET | Freq: Three times a day (TID) | ORAL | Status: DC | PRN

## 2015-08-21 MED ORDER — KETOROLAC TROMETHAMINE 30 MG/ML IJ SOLN
30.0000 mg | Freq: Once | INTRAMUSCULAR | Status: AC
  Administered 2015-08-21: 30 mg via INTRAVENOUS
  Filled 2015-08-21: qty 1

## 2015-08-21 MED ORDER — HYDROCODONE-ACETAMINOPHEN 5-325 MG PO TABS: 1.0000 | ORAL_TABLET | ORAL | Status: DC | PRN

## 2015-08-21 MED ORDER — OXYCODONE-ACETAMINOPHEN 5-325 MG PO TABS
2.0000 | ORAL_TABLET | Freq: Once | ORAL | Status: AC
  Administered 2015-08-21: 2 via ORAL
  Filled 2015-08-21: qty 2

## 2015-08-21 MED ORDER — METHOCARBAMOL 500 MG PO TABS: 1000.0000 mg | ORAL_TABLET | Freq: Four times a day (QID) | ORAL | Status: DC | PRN

## 2015-08-21 MED ORDER — MORPHINE SULFATE (PF) 4 MG/ML IV SOLN: 4.0000 mg | Freq: Once | INTRAVENOUS | Status: DC

## 2015-08-21 MED ORDER — METHOCARBAMOL 1000 MG/10ML IJ SOLN
1000.0000 mg | Freq: Once | INTRAMUSCULAR | Status: AC
  Administered 2015-08-21: 1000 mg via INTRAVENOUS
  Filled 2015-08-21: qty 10

## 2015-08-21 NOTE — ED Notes (Signed)
Explained to patient there was a delay in Ultras[facility] techn[facility].

## 2015-08-21 NOTE — Discharge Instructions (Signed)
Read the information below.  Use the prescribed medication as directed.  Please discuss all new medications with your pharmacist.  Do not take additional tylenol while taking the prescribed pain medication to avoid overdose.  You may return to the Emergency Department at any time for worsening condition or any new symptoms that concern you.  If you develop uncontrolled pain, weakness or numbness of the extremity, severe discoloration of the skin, or you are unable to move your arm, return to the ER for a recheck.      Musculoskeletal Pain Musculoskeletal pain is muscle and boney aches and pains. These pains can occur in any part of the body. Your caregiver may treat you without knowing the cause of the pain. They may treat you if blood or urine tests, X-rays, and other tests were normal.  CAUSES There is often not a definite cause or reason for these pains. These pains may be caused by a type of germ (virus). The discomfort may also come from overuse. Overuse includes working out too hard when your body is not fit. Boney aches also come from weather changes. Bone is sensitive to atmospheric pressure changes. HOME CARE INSTRUCTIONS   Ask when your test results will be ready. Make sure you get your test results.  Only take over-the-counter or prescription medicines for pain, discomfort, or fever as directed by your caregiver. If you were given medications for your condition, do not drive, operate machinery or power tools, or sign legal documents for 24 hours. Do not drink alcohol. Do not take sleeping pills or other medications that may interfere with treatment.  Continue all activities unless the activities cause more pain. When the pain lessens, slowly resume normal activities. Gradually increase the intensity and duration of the activities or exercise.  During periods of severe pain, bed rest may be helpful. Lay or sit in any position that is comfortable.  Putting ice on the injured area.  Put  ice in a bag.  Place a towel between your skin and the bag.  Leave the ice on for 15 to 20 minutes, 3 to 4 times a day.  Follow up with your caregiver for continued problems and no reason can be found for the pain. If the pain becomes worse or does not go away, it may be necessary to repeat tests or do additional testing. Your caregiver may need to look further for a possible cause. SEEK IMMEDIATE MEDICAL CARE IF:  You have pain that is getting worse and is not relieved by medications.  You develop chest pain that is associated with shortness or breath, sweating, feeling sick to your stomach (nauseous), or throw up (vomit).  Your pain becomes localized to the abdomen.  You develop any new symptoms that seem different or that concern you. MAKE SURE YOU:   Understand these instructions.  Will watch your condition.  Will get help right away if you are not doing well or get worse.   This information is not intended to replace advice given to you by your health care provider. Make sure you discuss any questions you have with your health care provider.   Document Released: 10/17/2005 Document Revised: 01/09/2012 Document Reviewed: 06/21/2013 Elsevier Interactive Patient Education 2016 [employer].    Emergency Department Resource Guide 1) Find a Doctor and Pay Out of Pocket Although you won't have to find out who is covered by your insurance plan, it is a good idea to ask around and get recommendations. You will then need  to call the office and see if the doctor you have chosen will accept you as a new patient and what types of options they offer for patients who are self-pay. Some doctors offer discounts or will set up payment plans for their patients who do not have insurance, but you will need to ask so you aren't surprised when you get to your appointment.  2) Contact Your Local Health Department Not all health departments have doctors that can see patients for sick visits, but  many do, so it is worth a call to see if yours does. If you don't know where your local health department is, you can check in your phone book. The CDC also has a tool to help you locate your state's health department, and many state websites also have listings of all of their local health departments.  3) Find a Walk-in Clinic If your illness is not likely to be very severe or complicated, you may want to try a walk in clinic. These are popping up all over the country in pharmacies, drugstores, and shopping centers. They're usually staffed by nurse practitioners or physician assistants that have been trained to treat common illnesses and complaints. They're usually fairly quick and inexpensive. However, if you have serious medical issues or chronic medical problems, these are probably not your best option.  No Primary Care Doctor: - Call Health Connect at  [phone] - they can help you locate a primary care doctor that  accepts your insurance, provides certain services, etc. - Physician Referral Service- [phone]  Chronic Pain Problems: Organization         Address  Phone   Notes  [facility]  [phone]) [phone] Patients need to be referred by their primary care doctor.   Medication Assistance: Organization         Address  Phone   Notes  [facility] Medication [facility] [address] [place], [place] [address] [phone] --Must be a resident of [place] [facility] [place] [facility] -- Must have NO insurance coverage whatsoever (no Medicaid/ Medicare, etc.) -- The pt. MUST have a primary care doctor that directs their care regularly and follows them in the community   MedAssist  [phone]   [employer]  [phone]) [phone]    Agencies that provide inexpensive medical care: Organization         Address  Phone   Notes  [facility]  [phone]   [facility]    [phone](336) [phone]   [facility] [address] [place], [place] [address] [phone]) [phone]   [facility] [place] [address], [place] [phone]) [phone]   [facility]anned Parenthood    [phone]   [facility]    [phone](336) [phone]   [facility]  [address], [place] Phone:  [phone], Fax:  [phone]) [phone] Hours of Operation:  9 am - 6 pm, M-F.  Also accepts Medicaid/Medicare and self-pay.  [facility]  [address], [place] Phone: [phone](336) [phone], Fax: [phone]. Hours of Operation:  8:30 am - 5:30 pm, M-F.  Also accepts Medicaid and self-pay.  [facility] [place] [address], [place] Phone: [phone](336) [phone]   [facility] [address], [place], [place] [phone], Ext. [phone] Mondays & Thursdays: 7-9 AM.  First 15 patients are seen on a first come, first serve basis.  Medicaid-accepting [facility] Providers:  Organization         Address  Phone   Notes  [facility] [address], [place] [phone] Also accepts self-pay patients.  [facility] [address] Laurell Josephs [place], [place]  [phone]   [facility] [address], [place] [phone]   [facility] [address], [place] [phone]   Renaye Rakers [address], [place]   [phone] Only accepts [place] Access [place] patients after they have their name applied to their card.   Self-Pay (no insurance) in [facility]:  Organization         Address  Phone   Notes  Sickle Cell Patients, [facility] [address] [place], [place] [phone]   [facility] [address] [place], [place] [phone]   [facility] [place]  [address], [place] [phone]   [facility]/Dr. Osei-Bonsu  [address], [place] or  [address], [place] [phone] Phone number for both [place] and [place] locations is the same.  [facility] [address], [place] [phone]   [facility] [address], [place] or [address] [phone]   [facility] [address] [place] [phone], phone; [phone], fax Sees patients 1st and 3rd Saturday of every month.  Must not qualify for public or private insurance (i.e. Medicaid, Medicare, [place] Health Choice, Veterans' Benefits)  Household income should be no more than 200% of the poverty level The clinic cannot treat you if you are pregnant or think you are pregnant  Sexually transmitted diseases are not treated at the clinic.    Dental Care: Organization         Address  Phone  Notes  [facility] Department of [facility] [address] [place], [place] [phone] Accepts children up to age 65 who are enrolled in [place] or [place] Health Choice; pregnant women with a Medicaid card; and children who have applied for Medicaid or [place] Health Choice, but were declined, whose parents can pay a reduced fee at time of service.  [facility] Department of [facility]  [address], [place] [phone] Accepts children up to age 22 who are enrolled in [place] or  Health Choice; pregnant women with a Medicaid card; and children who have applied for Medicaid or  Health Choice, but were declined, whose parents can pay a reduced fee at time of service.  [facility]  [address] [place], [place] [phone] Patients are seen by appointment only. Walk-ins are not accepted. [facility] will see patients 39 years of age and older. Monday - Tuesday (8am-5pm) Most Wednesdays (8:30-5pm) $30 per visit, cash only  [facility] Adult Dental Access PROGRAM  [address], [facility] [phone] Patients are seen by appointment only. Walk-ins are not accepted. [facility] will see patients 14 years of age and older. One Wednesday Evening (Monthly: Volunteer Based).  $30 per visit, cash only  [employer]  [phone] for adults; Children under age 78, call Graduate Pediatric Dentistry at [phone]. Children aged 55-14, please call [phone] to request a  pediatric application.  Dental services are provided in all areas of dental care including fillings, crowns and bridges, complete and partial dentures, implants, gum treatment, root canals, and extractions. Preventive care is also provided. Treatment is provided to both adults and children. Patients are selected via a lottery and there is often a waiting list.   [facility] [address] [place]  [phone] [email or web address]   [facility] [address] [place], [place] [phone], Ext. [phone] Second and Fourth Thursday of each month, opens at 6:30 AM; Clinic ends at 9 AM.  Patients are seen on a first-come first-served basis, and a limited number are seen during each clinic.   [facility]  [address] Ether Griffins [place], [place] [phone]   Eligibility Requirements You must have lived in [place], [place], or [place] counties for at least the last three months.   You cannot be eligible for state or federal sponsored [place], including [employer], [place], or [employer].   You generally cannot be eligible for healthcare insurance through your employer.    How to apply: Eligibility screenings are held every Tuesday and Wednesday afternoon from 1:00 pm until 4:00 pm. You do not need an appointment for the interview!  [facility] [address], Terelle Dobler [place], [place] [phone]   [facility] Health Department  [phone]   [facility] Health Department  [phone]   [facility]  Health Department  [phone]    Behavioral Health Resources in the Community: Intensive Outpatient Programs Organization         Address  Phone  Notes  [facility] Services [address] Ethelmae Ringel [place], [place] [phone]   [facility]patient [address], [place], [place] [phone]   [facility] [address] [place], [place]  [phone]   [facility] Mental Health [address]  [place], [place] [phone] or [phone]   Substance Abuse Resources Organization         Address  Phone  Notes  Alcohol [facility] Drug Services  [phone]   [facility]  [phone]   [facility]  [phone]   Floydene Flock  [phone]   Residential & Outpatient Substance Abuse Program  [phone]   Psychological Services Organization         Address  Phone  Notes  [facility] Behavioral Health  [phone]   [facility] Services  [phone]   [facility] Mental Health [address], [place] [phone] or [phone]    Mobile Crisis Teams Organization         Address  Phone  Notes  [facility], Mobile Crisis Care Unit  [phone]   [facility]  [address] [place], [place] [phone]   Doristine Locks [address] [place] [phone]    Self-Help/Support Groups Organization         Address  Phone             Notes  [facility] [place] - variety of support groups  336- I7437963 Call for more information  Narcotics Anonymous (NA), [facility] [address], [employer] [place]  2 meetings at this location   Statistician         Address  Phone  Notes  [facility] [address],    [place]  [phone]   [facility]  [address], [place] [address],  [place], [place] [phone]   [facility] [address] [place], [place] [phone]  Admissions: 8am-3pm M-F  [facility] [address]    [place], [place] [phone]   [facility] [address] [place], [place], [place] [phone]   [facility] [address],  [place], [place] [phone]   [facility] - Intensive Outpatient [address] Laurell Josephs [address], [place], [place] [phone]   [facility] ([facility] [address] [place]  [place], [place] [phone] or [phone]   [facility] (RT[facility]) [address], [place], [place] [phone] Accepts Medicaid  [facility] [address],  [place] [phone] Substance Abuse/Addiction Treatment   [facility] Organization         Address  Phone  Notes  [facility]  [phone]   Angie Fava, PhD [address] Ervin Knack [place], [place]   [phone] or [phone]   [facility] Behavioral   [address] [place], [place] [phone]   [facility] [address] [place], [place] [phone] Insurance/Medicaid/sponsorship through [facility] and Families [address]                                    [place], [place] [phone] Therapy/tele-psych/case  [facility] [address], [place] [phone]    Dr. Lolly Mustache  [phone]   [facility]  [employer] [facility] Dept. 1) [address], [place] 2) [address], [place] 3)  [address], [place] [phone]  [phone]   [facility] Child Abuse Hotline (906)538-0222 or [phone] (After Hours)

## 2015-08-21 NOTE — ED Provider Notes (Signed)
CSN: [id number]     Arrival date & time 08/21/15  1458 History   First MD Initiated Contact with Patient 08/21/15 1627     Chief Complaint  Patient presents with  . Arm Pain     (Consider location/radiation/quality/duration/timing/severity/associated sxs/prior Treatment) The history is provided by the patient.     Pt with hx morbid obesity, OSA, "anginal pain," presents with left arm pain that began 3-4 hours ago.  Pain is constant, starts in left shoulder and radiates to his fingers, worse with any kind of movement of his arm.  He has had tingling in his fingertips.  Denies fevers, trauma, heavy lifting, neck pain, CP, SOB, recent illness.  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  Notes he frequently reaches forward with his left arm to touch his phone in his truck and also recently reached underneath and behind himself to get a seatbelt.  No hx blood clots.  He is left handed.  Has hx left carpal tunnel surgery in early 2016.   Past Medical History  Diagnosis Date  . 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 Tonsillectomy     Family History  Problem Relation Age of Onset  . Diabetes Mother   . Hypertension Mother   . Hypertension Father   . Diabetes Father    Social History  Substance Use Topics  . Smoking status: Light Tobacco Smoker    Types: Cigars  . Smokeless tobacco: Never Used  . Alcohol Use: Yes     Comment: once a month    Review of Systems  All other systems reviewed and are negative.     Allergies  Review of patient's allergies indicates no known allergies.  Home Medications   Prior to Admission medications   Medication Sig Start Date End Date Taking? Authorizing Provider  aspirin 81 MG chewable tablet Chew 81 mg by mouth daily.    Historical Provider, MD  ibuprofen (ADVIL,MOTRIN) 200 MG tablet Take 800 mg by mouth every 8  (eight) hours as needed for pain.     Historical Provider, MD  Multiple Vitamin (MULTIVITAMIN WITH MINERALS) TABS Take 1 tablet by mouth daily.    Historical Provider, MD   BP 153/85 mmHg  Pulse 88  Temp(Src) 98.3 F (36.8 C) (Oral)  Resp 18  Ht 5\' 5"  (1.651 m)  Wt 310 lb (140.615 kg)  BMI 51.59 kg/m2  SpO2 95% Physical Exam  Constitutional: He appears well-developed and well-nourished. No distress.  HENT:  Head: Normocephalic and atraumatic.  Neck: Normal range of motion. Neck supple.  Cardiovascular: Normal rate, regular rhythm and intact distal pulses.   Pulmonary/Chest: Effort normal and breath sounds normal. No respiratory distress. He has no wheezes. He has no rales.  Musculoskeletal:  Spine nontender, no crepitus, or stepoffs.   Pt holding left shoulder higher than right.  Diffuse edema vs spasm of left trapezius, tender to palpation.  Tenderness throughout left arm.  Distal pulses intact, capillary refill < 2 seconds.  Pain with passive ROM of any joint.    Neurological: He is alert.  Skin: He is not diaphoretic.  Nursing note and vitals reviewed.   ED Course  Procedures (including critical care time) Labs Review Labs Reviewed  I-STAT CHEM 8, ED - Abnormal; Notable for the following:    Glucose, Bld 119 (*)  All other components within normal limits  CBC WITH DIFFERENTIAL/PLATELET    Imaging Review No results found. I have personally reviewed and evaluated these images and lab results as part of my medical decision-making.   EKG Interpretation None       5:09 PM Discussed pt with Dr Lynelle Doctor who will also see and examine the patient.   7:43 PM Venous duplex US negative.  No other abnormalities noted.  Discussed results and plan with Dr Lynelle Doctor. Plan for d/c home with pain medication/muscle relaxant.    MDM   Final diagnoses:  Left arm pain   Afebrile, nontoxic patient with left arm pain from shoulder to hand.  Neurovascularly intact.  Left trapezius  appears inflamed, is tender.  He is tender throughout the left arm without focal tenderness.  No erythema or warmth of any joint.   No apparent cellulitis.  The pain does not seem to be dermatomal.  He does describe a few episodes during which he might have strained his left shoulder.  No significant injuries.  Venous duplex negative for clot. Labs remarkable only for mildly elevated glucose.  Pt also seen and examined by Dr Lynelle Doctor who agrees this seems musculoskeletal.  Pt already has an appointment with his orthopedist in 4 days for chronic left carpal tunnel issues.   D/C home with norco, robaxin, ibuprofen, orthopedic/PCP follow up.  Discussed result, findings, treatment, and follow up  with patient.  Pt given return precautions.  Pt verbalizes understanding and agrees with plan.       I doubt any other EMC precluding discharge at this time including, but not necessarily limited to the following: septic joint, compartment syndrome, ACS, DVT, cellulitis, acute cord compression.    Trixie Dredge, PA-C 08/21/15 2118  Linwood Dibbles, MD 08/22/15 [phone]

## 2015-08-21 NOTE — ED Notes (Signed)
Pt states that he started having lt arm pain and that his fingertips are now numb.  States this happened 2 hrs ago.

## 2015-08-21 NOTE — Progress Notes (Signed)
*  Preliminary Results* Left upper extremity venous duplex completed. Study was technically limited due to patient body habitus, depth of vessels, and patient position. Visualized veins of the left upper extremity are negative for deep and superficial vein thrombosis.  08/21/2015 7:52 PM  Gertie FeyMichelle Idan Prime, RVT, RDCS, RDMS

## 2015-09-20 ENCOUNTER — Emergency Department (HOSPITAL_COMMUNITY): Payer: Medicaid Other

## 2015-09-20 ENCOUNTER — Emergency Department (HOSPITAL_COMMUNITY)
Admission: EM | Admit: 2015-09-20 | Discharge: 2015-09-20 | Disposition: A | Discharge status: Home / Self Care | Payer: Medicaid Other | Attending: Emergency Medicine | Admitting: Emergency Medicine

## 2015-09-20 ENCOUNTER — Encounter (HOSPITAL_COMMUNITY): Payer: Self-pay | Admitting: *Deleted

## 2015-09-20 DIAGNOSIS — F17210 Nicotine dependence, cigarettes, uncomplicated: Secondary | ICD-10-CM | POA: Diagnosis not present

## 2015-09-20 DIAGNOSIS — Z7982 Long term (current) use of aspirin: Secondary | ICD-10-CM | POA: Diagnosis not present

## 2015-09-20 DIAGNOSIS — Y9289 Other specified places as the place of occurrence of the external cause: Secondary | ICD-10-CM | POA: Insufficient documentation

## 2015-09-20 DIAGNOSIS — S0012XA Contusion of left eyelid and periocular area, initial encounter: Secondary | ICD-10-CM | POA: Insufficient documentation

## 2015-09-20 DIAGNOSIS — S6992XA Unspecified injury of left wrist, hand and finger(s), initial encounter: Secondary | ICD-10-CM | POA: Insufficient documentation

## 2015-09-20 DIAGNOSIS — Z79899 Other long term (current) drug therapy: Secondary | ICD-10-CM | POA: Diagnosis not present

## 2015-09-20 DIAGNOSIS — Y9389 Activity, other specified: Secondary | ICD-10-CM | POA: Diagnosis not present

## 2015-09-20 DIAGNOSIS — Y998 Other external cause status: Secondary | ICD-10-CM | POA: Insufficient documentation

## 2015-09-20 DIAGNOSIS — S0592XA Unspecified injury of left eye and orbit, initial encounter: Secondary | ICD-10-CM | POA: Diagnosis present

## 2015-09-20 DIAGNOSIS — M79642 Pain in left hand: Secondary | ICD-10-CM

## 2015-09-20 DIAGNOSIS — S0512XA Contusion of eyeball and orbital tissues, left eye, initial encounter: Secondary | ICD-10-CM

## 2015-09-20 MED ORDER — TETRACAINE HCL 0.5 % OP SOLN
2.0000 [drp] | Freq: Once | OPHTHALMIC | Status: AC
  Administered 2015-09-20: 2 [drp] via OPHTHALMIC
  Filled 2015-09-20: qty 2

## 2015-09-20 MED ORDER — FLUORESCEIN SODIUM 1 MG OP STRP
1.0000 | ORAL_STRIP | Freq: Once | OPHTHALMIC | Status: AC
  Administered 2015-09-20: 1 via OPHTHALMIC
  Filled 2015-09-20: qty 1

## 2015-09-20 MED ORDER — IBUPROFEN 800 MG PO TABS: 800.0000 mg | ORAL_TABLET | Freq: Three times a day (TID) | ORAL | Status: DC | PRN

## 2015-09-20 MED ORDER — CYCLOBENZAPRINE HCL 10 MG PO TABS: 10.0000 mg | ORAL_TABLET | Freq: Three times a day (TID) | ORAL | Status: DC | PRN

## 2015-09-20 MED ORDER — ERYTHROMYCIN 5 MG/GM OP OINT: TOPICAL_OINTMENT | OPHTHALMIC | Status: DC
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
## 2015-09-20 NOTE — Discharge Instructions (Signed)
Read the information below.  Use the prescribed medication as directed.  Please discuss all new medications with your pharmacist.  You may return to the Emergency Department at any time for worsening condition or any new symptoms that concern you.    If you develop worsening pain in your eye, change in your vision, swelling around your eye, difficulty moving your eye, or fevers greater than 100.4, see your eye doctor or return to the Emergency Department immediately for a recheck.      Contusion A contusion is a deep bruise. Contusions happen when an injury causes bleeding under the skin. Symptoms of bruising include pain, swelling, and discolored skin. The skin may turn blue, purple, or yellow. HOME CARE   Rest the injured area.  If told, put ice on the injured area.  Put ice in a plastic bag.  Place a towel between your skin and the bag.  Leave the ice on for 20 minutes, 2-3 times per day.  If told, put light pressure (compression) on the injured area using an elastic bandage. Make sure the bandage is not too tight. Remove it and put it back on as told by your doctor.  If possible, raise (elevate) the injured area above the level of your heart while you are sitting or lying down.  Take over-the-counter and prescription medicines only as told by your doctor. GET HELP IF:  Your symptoms do not get better after several days of treatment.  Your symptoms get worse.  You have trouble moving the injured area. GET HELP RIGHT AWAY IF:   You have very bad pain.  You have a loss of feeling (numbness) in a hand or foot.  Your hand or foot turns pale or cold.   This information is not intended to replace advice given to you by your health care provider. Make sure you discuss any questions you have with your health care provider.   Document Released: 04/04/2008 Document Revised: 07/08/2015 Document Reviewed: 03/04/2015 Elsevier Interactive Patient Education 2016 Elsevier  Inc.  Musculoskeletal Pain Musculoskeletal pain is muscle and boney aches and pains. These pains can occur in any part of the body. Your caregiver may treat you without knowing the cause of the pain. They may treat you if blood or urine tests, X-rays, and other tests were normal.  CAUSES There is often not a definite cause or reason for these pains. These pains may be caused by a type of germ (virus). The discomfort may also come from overuse. Overuse includes working out too hard when your body is not fit. Boney aches also come from weather changes. Bone is sensitive to atmospheric pressure changes. HOME CARE INSTRUCTIONS   Ask when your test results will be ready. Make sure you get your test results.  Only take over-the-counter or prescription medicines for pain, discomfort, or fever as directed by your caregiver. If you were given medications for your condition, do not drive, operate machinery or power tools, or sign legal documents for 24 hours. Do not drink alcohol. Do not take sleeping pills or other medications that may interfere with treatment.  Continue all activities unless the activities cause more pain. When the pain lessens, slowly resume normal activities. Gradually increase the intensity and duration of the activities or exercise.  During periods of severe pain, bed rest may be helpful. Lay or sit in any position that is comfortable.  Putting ice on the injured area.  Put ice in a bag.  Place a towel between your  skin and the bag.  Leave the ice on for 15 to 20 minutes, 3 to 4 times a day.  Follow up with your caregiver for continued problems and no reason can be found for the pain. If the pain becomes worse or does not go away, it may be necessary to repeat tests or do additional testing. Your caregiver may need to look further for a possible cause. SEEK IMMEDIATE MEDICAL CARE IF:  You have pain that is getting worse and is not relieved by medications.  You develop chest  pain that is associated with shortness or breath, sweating, feeling sick to your stomach (nauseous), or throw up (vomit).  Your pain becomes localized to the abdomen.  You develop any new symptoms that seem different or that concern you. MAKE SURE YOU:   Understand these instructions.  Will watch your condition.  Will get help right away if you are not doing well or get worse.   This information is not intended to replace advice given to you by your health care provider. Make sure you discuss any questions you have with your health care provider.   Document Released: 10/17/2005 Document Revised: 01/09/2012 Document Reviewed: 06/21/2013 Elsevier Interactive Patient Education [employer] Elsevier Inc.

## 2015-09-29 ENCOUNTER — Emergency Department (HOSPITAL_COMMUNITY): Payer: Medicaid Other

## 2015-09-29 ENCOUNTER — Emergency Department (HOSPITAL_COMMUNITY)
Admission: EM | Admit: 2015-09-29 | Discharge: 2015-09-29 | Disposition: A | Discharge status: Home / Self Care | Payer: Medicaid Other | Attending: Emergency Medicine | Admitting: Emergency Medicine

## 2015-09-29 ENCOUNTER — Encounter (HOSPITAL_COMMUNITY): Payer: Self-pay | Admitting: Emergency Medicine

## 2015-09-29 DIAGNOSIS — Z8679 Personal history of other diseases of the circulatory system: Secondary | ICD-10-CM | POA: Diagnosis not present

## 2015-09-29 DIAGNOSIS — G44309 Post-traumatic headache, unspecified, not intractable: Secondary | ICD-10-CM | POA: Diagnosis present

## 2015-09-29 DIAGNOSIS — F17210 Nicotine dependence, cigarettes, uncomplicated: Secondary | ICD-10-CM | POA: Diagnosis not present

## 2015-09-29 DIAGNOSIS — F0781 Postconcussional syndrome: Secondary | ICD-10-CM

## 2015-09-29 DIAGNOSIS — H538 Other visual disturbances: Secondary | ICD-10-CM | POA: Insufficient documentation

## 2015-09-29 DIAGNOSIS — Z7982 Long term (current) use of aspirin: Secondary | ICD-10-CM | POA: Diagnosis not present

## 2015-09-29 DIAGNOSIS — M79661 Pain in right lower leg: Secondary | ICD-10-CM | POA: Diagnosis not present

## 2015-09-29 LAB — BASIC METABOLIC PANEL
Anion gap: 8 (ref 5–15)
BUN: 11 mg/dL (ref 6–20)
CO2: 28 mmol/L (ref 22–32)
Calcium: 9.5 mg/dL (ref 8.9–10.3)
Chloride: 105 mmol/L (ref 101–111)
Creatinine, Ser: 0.96 mg/dL (ref 0.61–1.24)
GFR calc Af Amer: >60 mL/min (ref >60)
GFR calc non Af Amer: >60 mL/min (ref >60)
Glucose, Bld: 108 mg/dL — ABNORMAL HIGH (ref 65–99)
Potassium: 3.9 mmol/L (ref 3.5–5.1)
Sodium: 141 mmol/L (ref 135–145)

## 2015-09-29 LAB — CBC
HCT: 43.3 % (ref 39.0–52.0)
Hemoglobin: 14.1 g/dL (ref 13.0–17.0)
MCH: 27.4 pg (ref 26.0–34.0)
MCHC: 32.6 g/dL (ref 30.0–36.0)
MCV: 84.2 fL (ref 78.0–100.0)
Platelets: 273 10*3/uL (ref 150–400)
RBC: 5.14 MIL/uL (ref 4.22–5.81)
RDW: 14.4 % (ref 11.5–15.5)
WBC: 6.5 10*3/uL (ref 4.0–10.5)

## 2015-09-29 MED ORDER — BUTALBITAL-APAP-CAFFEINE 50-325-40 MG PO TABS: 1.0000 | ORAL_TABLET | Freq: Four times a day (QID) | ORAL | Status: AC | PRN

## 2015-09-29 MED ORDER — IBUPROFEN 800 MG PO TABS
800.0000 mg | ORAL_TABLET | Freq: Once | ORAL | Status: AC
  Administered 2015-09-29: 800 mg via ORAL
  Filled 2015-09-29: qty 1
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
## 2015-09-29 NOTE — Discharge Instructions (Signed)

## 2015-10-02 ENCOUNTER — Other Ambulatory Visit: Payer: Self-pay | Admitting: Orthopedic Surgery

## 2015-10-02 DIAGNOSIS — M542 Cervicalgia: Secondary | ICD-10-CM

## 2015-10-11 ENCOUNTER — Other Ambulatory Visit: Payer: Medicaid Other

## 2015-10-28 ENCOUNTER — Other Ambulatory Visit: Payer: Medicaid Other

## 2015-11-08 ENCOUNTER — Other Ambulatory Visit: Payer: Medicaid Other

## 2016-01-12 ENCOUNTER — Emergency Department (HOSPITAL_COMMUNITY): Payer: Medicaid Other

## 2016-01-12 ENCOUNTER — Encounter (HOSPITAL_COMMUNITY): Payer: Self-pay

## 2016-01-12 ENCOUNTER — Emergency Department (HOSPITAL_COMMUNITY)
Admission: EM | Admit: 2016-01-12 | Discharge: 2016-01-12 | Disposition: A | Discharge status: Home / Self Care | Payer: Medicaid Other | Attending: Emergency Medicine | Admitting: Emergency Medicine

## 2016-01-12 DIAGNOSIS — R51 Headache: Secondary | ICD-10-CM | POA: Insufficient documentation

## 2016-01-12 DIAGNOSIS — R202 Paresthesia of skin: Secondary | ICD-10-CM | POA: Diagnosis not present

## 2016-01-12 DIAGNOSIS — R55 Syncope and collapse: Secondary | ICD-10-CM

## 2016-01-12 DIAGNOSIS — I209 Angina pectoris, unspecified: Secondary | ICD-10-CM | POA: Diagnosis not present

## 2016-01-12 DIAGNOSIS — R064 Hyperventilation: Secondary | ICD-10-CM

## 2016-01-12 DIAGNOSIS — Z7982 Long term (current) use of aspirin: Secondary | ICD-10-CM | POA: Diagnosis not present

## 2016-01-12 DIAGNOSIS — F17210 Nicotine dependence, cigarettes, uncomplicated: Secondary | ICD-10-CM | POA: Insufficient documentation

## 2016-01-12 DIAGNOSIS — Z79899 Other long term (current) drug therapy: Secondary | ICD-10-CM | POA: Diagnosis not present

## 2016-01-12 DIAGNOSIS — R519 Headache, unspecified: Secondary | ICD-10-CM

## 2016-01-12 LAB — BASIC METABOLIC PANEL
Anion gap: 9 (ref 5–15)
BUN: 12 mg/dL (ref 6–20)
CO2: 28 mmol/L (ref 22–32)
Calcium: 9.2 mg/dL (ref 8.9–10.3)
Chloride: 105 mmol/L (ref 101–111)
Creatinine, Ser: 0.94 mg/dL (ref 0.61–1.24)
GFR calc Af Amer: >60 mL/min (ref >60)
GFR calc non Af Amer: >60 mL/min (ref >60)
Glucose, Bld: 104 mg/dL — ABNORMAL HIGH (ref 65–99)
Potassium: 4 mmol/L (ref 3.5–5.1)
Sodium: 142 mmol/L (ref 135–145)

## 2016-01-12 LAB — URINALYSIS, ROUTINE W REFLEX MICROSCOPIC
Bilirubin Urine: NEGATIVE
Glucose, UA: NEGATIVE mg/dL
Hgb urine dipstick: NEGATIVE
Ketones, ur: NEGATIVE mg/dL
Leukocytes, UA: NEGATIVE
Nitrite: NEGATIVE
Protein, ur: NEGATIVE mg/dL
Specific Gravity, Urine: 1.02 (ref 1.005–1.030)
pH: 7.5 (ref 5.0–8.0)

## 2016-01-12 LAB — CBG MONITORING, ED: Glucose-Capillary: 128 mg/dL — ABNORMAL HIGH (ref 65–99)

## 2016-01-12 LAB — CBC
HCT: 43.9 % (ref 39.0–52.0)
Hemoglobin: 14.1 g/dL (ref 13.0–17.0)
MCH: 27.8 pg (ref 26.0–34.0)
MCHC: 32.1 g/dL (ref 30.0–36.0)
MCV: 86.4 fL (ref 78.0–100.0)
Platelets: 310 10*3/uL (ref 150–400)
RBC: 5.08 MIL/uL (ref 4.22–5.81)
RDW: 14.9 % (ref 11.5–15.5)
WBC: 7 10*3/uL (ref 4.0–10.5)

## 2016-01-12 LAB — TROPONIN I: Troponin I: <0.03 ng/mL (ref <0.031)

## 2016-01-12 MED ORDER — ACETAMINOPHEN 500 MG PO TABS
1000.0000 mg | ORAL_TABLET | Freq: Once | ORAL | Status: AC
  Administered 2016-01-12: 1000 mg via ORAL
  Filled 2016-01-12: qty 2

## 2016-01-12 NOTE — ED Provider Notes (Signed)
CSN: [id number]     Arrival date & time 01/12/16  1347 History   First MD Initiated Contact with Patient 01/12/16 1515     Chief Complaint  Patient presents with  . Loss of Consciousness     (Consider location/radiation/quality/duration/timing/severity/associated sxs/prior Treatment) Patient is a 45 y.o. male presenting with syncope. The history is provided by the patient.  Loss of Consciousness Associated symptoms: no chest pain, no confusion, no fever, no headaches, no palpitations, no shortness of breath, no vomiting and no weakness   Patient c/o syncopal event at work today. States was feeling lightheaded/faint/dizzy just prior. States had started new job, has had to site awkwardly on front of chair, and that gets his back and legs tired. States today, then felt warm/hot, then started feeling sob, then felt numb/tingly in bilateral hands and feet, and then laid head down onto desk. Notes intermittent, gradual onset frontal headaches, mild. No severe head pain, but patient concerned as he indicates he never gets headaches. Pt also states he feels subcut nodule in forehead and feels that may be related to head pain.   Denies fever or chills. No chest pain or discomfort. No palpitations or sense of rapid or irregular beating. No personal hx cad or heart disease. No hx dysrhythmia. No fam hx premature cad, grandparent had 'heart problems'.   Normal appetite, had eaten today. No vomiting or diarrhea. No blood in stool. No weakness. No unilateral numbness. No change in speech or vision. No recent change in meds.     Past Medical History  Diagnosis Date  . 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 Tonsillectomy     Family History  Problem Relation Age of Onset  . Diabetes Mother   . Hypertension Mother   . Hypertension Father   . Diabetes Father    Social History  Substance Use Topics  .  Smoking status: Light Tobacco Smoker    Types: Cigars  . Smokeless tobacco: Never Used  . Alcohol Use: Yes     Comment: once a month    Review of Systems  Constitutional: Negative for fever and chills.  HENT: Negative for sore throat.   Eyes: Negative for redness.  Respiratory: Negative for cough and shortness of breath.   Cardiovascular: Positive for syncope. Negative for chest pain and palpitations.  Gastrointestinal: Negative for vomiting, abdominal pain, diarrhea and blood in stool.  Endocrine: Negative for polyuria.  Genitourinary: Negative for dysuria and flank pain.  Musculoskeletal: Negative for back pain and neck pain.  Skin: Negative for rash.  Neurological: Negative for weakness and headaches.  Hematological: Does not bruise/bleed easily.  Psychiatric/Behavioral: Negative for confusion.      Allergies  Review of patient's allergies indicates no known allergies.  Home Medications   Prior to Admission medications   Medication Sig Start Date End Date Taking? Authorizing Provider  aspirin EC 81 MG tablet Take 81 mg by mouth every morning.   Yes Historical Provider, MD  butalbital-acetaminophen-caffeine (FIORICET) 50-325-40 MG tablet Take 1-2 tablets by mouth every 6 (six) hours as needed for headache. 09/29/15 09/28/16 Yes Eber Hong, MD  Multiple Vitamin (MULTIVITAMIN WITH MINERALS) TABS tablet Take 1 tablet by mouth daily.   Yes Historical Provider, MD  cyclobenzaprine (FLEXERIL) 10 MG tablet Take 1 tablet (10 mg total) by mouth 3 (three) times daily as needed for muscle spasms (and pain). 09/20/15  Trixie DredgeEmily West, PA-C  erythromycin ophthalmic ointment Place a 1/2 inch ribbon of ointment into the lower eyelid of left eye twice daily  x 1 week. 09/20/15   Trixie DredgeEmily West, PA-C  ibuprofen (ADVIL,MOTRIN) 800 MG tablet Take 1 tablet (800 mg total) by mouth every 8 (eight) hours as needed for mild pain or moderate pain. 09/20/15   Trixie DredgeEmily West, PA-C   BP 140/58 mmHg  Pulse 82   Temp(Src) 98.5 F (36.9 C)  Resp 19  SpO2 97% Physical Exam  Constitutional: He is oriented to person, place, and time. He appears well-developed and well-nourished. No distress.  HENT:  Head: Atraumatic.  Mouth/Throat: Oropharynx is clear and moist.  No forehead mass/abscess felt. Skin appears normal. No gross edema noted. No sinus or temporal tenderness.   Eyes: Conjunctivae are normal. Pupils are equal, round, and reactive to light. No scleral icterus.  Neck: Normal range of motion. Neck supple. No tracheal deviation present. No thyromegaly present.  No bruits.   Cardiovascular: Normal rate, regular rhythm, normal heart sounds and intact distal pulses.  Exam reveals no gallop and no friction rub.   No murmur heard. Pulmonary/Chest: Effort normal and breath sounds normal. No accessory muscle usage. No respiratory distress.  Abdominal: Soft. Bowel sounds are normal. He exhibits no distension and no mass. There is no tenderness. There is no rebound and no guarding.  Genitourinary:  No cva tenderness  Musculoskeletal: Normal range of motion. He exhibits no edema or tenderness.  Neurological: He is alert and oriented to person, place, and time.  Motor intact bil, stre 5/5. sens grossly intact.   Skin: Skin is warm and dry. No rash noted. He is not diaphoretic.  Psychiatric: He has a normal mood and affect.  Nursing note and vitals reviewed.   ED Course  Procedures (including critical care time) Labs Review   Results for orders placed or performed during the hospital encounter of 01/12/16  Basic metabolic panel  Result Value Ref Range   Sodium 142 135 - 145 mmol/L   Potassium 4.0 3.5 - 5.1 mmol/L   Chloride 105 101 - 111 mmol/L   CO2 28 22 - 32 mmol/L   Glucose, Bld 104 (H) 65 - 99 mg/dL   BUN 12 6 - 20 mg/dL   Creatinine, Ser 1.610.94 0.61 - 1.24 mg/dL   Calcium 9.2 8.9 - 09.610.3 mg/dL   GFR calc non Af Amer >60 >60 mL/min   GFR calc Af Amer >60 >60 mL/min   Anion gap 9 5 - 15   CBC  Result Value Ref Range   WBC 7.0 4.0 - 10.5 K/uL   RBC 5.08 4.22 - 5.81 MIL/uL   Hemoglobin 14.1 13.0 - 17.0 g/dL   HCT 04.543.9 40.939.0 - 81.152.0 %   MCV 86.4 78.0 - 100.0 fL   MCH 27.8 26.0 - 34.0 pg   MCHC 32.1 30.0 - 36.0 g/dL   RDW 91.414.9 78.211.5 - 95.615.5 %   Platelets 310 150 - 400 K/uL  Urinalysis, Routine w reflex microscopic (not [facility])  Result Value Ref Range   Color, Urine YELLOW YELLOW   APPearance CLEAR CLEAR   Specific Gravity, Urine 1.020 1.005 - 1.030   pH 7.5 5.0 - 8.0   Glucose, UA NEGATIVE NEGATIVE mg/dL   Hgb urine dipstick NEGATIVE NEGATIVE   Bilirubin Urine NEGATIVE NEGATIVE   Ketones, ur NEGATIVE NEGATIVE mg/dL   Protein, ur NEGATIVE NEGATIVE mg/dL   Nitrite NEGATIVE NEGATIVE   Leukocytes, UA NEGATIVE NEGATIVE  Troponin I  Result Value Ref Range   Troponin I <0.03 <0.031 ng/mL  CBG monitoring, ED  Result Value Ref Range   Glucose-Capillary 128 (H) 65 - 99 mg/dL   Ct Head Wo Contrast  01/12/2016  CLINICAL DATA:  Syncopal episode at work, headache. EXAM: CT HEAD WITHOUT CONTRAST TECHNIQUE: Contiguous axial images were obtained from the base of the skull through the vertex without intravenous contrast. COMPARISON:  Head CT dated 09/29/2015. FINDINGS: Brain: Ventricles remain normal in size and configuration. All areas of the brain demonstrate normal gray-white matter attenuation. There is no mass, hemorrhage, edema or other evidence of acute parenchymal abnormality. No extra-axial hemorrhage. Vascular: No hyperdense vessel or unexpected calcification. Skull: Negative for fracture or focal lesion. Sinuses/Orbits: No acute findings. 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 Other: Superficial soft tissues are unremarkable. IMPRESSION: Negative head CT. Electronically Signed   By: Bary Richard M.D.   On: 01/12/2016 16:04       I have personally reviewed and evaluated these images and lab results as part of my medical decision-making.   EKG  Interpretation   Date/Time:  Tuesday January 12 2016 14:09:11 EDT Ventricular Rate:  91 PR Interval:  151 QRS Duration: 101 QT Interval:  377 QTC Calculation: 464 R Axis:   4 Text Interpretation:  Sinus rhythm No significant change since last  tracing Confirmed by Denton Lank  MD, Caryn Bee ([id number]) on 01/12/2016 3:17:04 PM      MDM   Labs. Po fluids.  Reviewed nursing notes and prior charts for additional history.   Ct neg acute.  Tylenol po.  Po fluids.  Recheck, nsr on monitor, no dysrhythmia.  No return of faintness or dizziness.   Pt currently appears stable for d/c.        Cathren Laine, MD 01/12/16 1825

## 2016-01-12 NOTE — ED Notes (Signed)
Fluids given to patient.  [facility]

## 2016-01-12 NOTE — ED Notes (Signed)
Patient transported to CT 

## 2016-01-12 NOTE — Discharge Instructions (Signed)
It was our pleasure to provide your ER care today - we hope that you feel better.  Rest. Drink plenty of fluids.  Take tylenol/advil as need.  Your head ct scan was read as normal.  Follow up with primary care doctor in the next couple days for recheck.   Return to ER if worse, new symptoms, fevers, weak/fainting, chest pain, trouble breathing, other concern.     Syncope Syncope is a medical term for fainting or passing out. This means you lose consciousness and drop to the ground. People are generally unconscious for less than 5 minutes. You may have some muscle twitches for up to 15 seconds before waking up and returning to normal. Syncope occurs more often in older adults, but it can happen to anyone. While most causes of syncope are not dangerous, syncope can be a sign of a serious medical problem. It is important to seek medical care.  CAUSES  Syncope is caused by a sudden drop in blood flow to the brain. The specific cause is often not determined. Factors that can bring on syncope include:  Taking medicines that lower blood pressure.  Sudden changes in posture, such as standing up quickly.  Taking more medicine than prescribed.  Standing in one place for too long.  Seizure disorders.  Dehydration and excessive exposure to heat.  Low blood sugar (hypoglycemia).  Straining to have a bowel movement.  Heart disease, irregular heartbeat, or other circulatory problems.  Fear, emotional distress, seeing blood, or severe pain. SYMPTOMS  Right before fainting, you may:  Feel dizzy or light-headed.  Feel nauseous.  See all white or all black in your field of vision.  Have cold, clammy skin. DIAGNOSIS  Your health care provider will ask about your symptoms, perform a physical exam, and perform an electrocardiogram (ECG) to record the electrical activity of your heart. Your health care provider may also perform other heart or blood tests to determine the cause of your  syncope which may include:  Transthoracic echocardiogram (TTE). During echocardiography, sound waves are used to evaluate how blood flows through your heart.  Transesophageal echocardiogram (TEE).  Cardiac monitoring. This allows your health care provider to monitor your heart rate and rhythm in real time.  Holter monitor. This is a portable device that records your heartbeat and can help diagnose heart arrhythmias. It allows your health care provider to track your heart activity for several days, if needed.  Stress tests by exercise or by giving medicine that makes the heart beat faster. TREATMENT  In most cases, no treatment is needed. Depending on the cause of your syncope, your health care provider may recommend changing or stopping some of your medicines. HOME CARE INSTRUCTIONS  Have someone stay with you until you feel stable.  Do not drive, use machinery, or play sports until your health care provider says it is okay.  Keep all follow-up appointments as directed by your health care provider.  Lie down right away if you start feeling like you might faint. Breathe deeply and steadily. Wait until all the symptoms have passed.  Drink enough fluids to keep your urine clear or pale yellow.  If you are taking blood pressure or heart medicine, get up slowly and take several minutes to sit and then stand. This can reduce dizziness. SEEK IMMEDIATE MEDICAL CARE IF:   You have a severe headache.  You have unusual pain in the chest, abdomen, or back.  You are bleeding from your mouth or rectum, or you  have black or tarry stool.  You have an irregular or very fast heartbeat.  You have pain with breathing.  You have repeated fainting or seizure-like jerking during an episode.  You faint when sitting or lying down.  You have confusion.  You have trouble walking.  You have severe weakness.  You have vision problems. If you fainted, call your local emergency services (911 in  [place] Do not drive yourself to the hospital.    This information is not intended to replace advice given to you by your health care provider. Make sure you discuss any questions you have with your health care provider.   Document Released: 10/17/2005 Document Revised: 03/03/2015 Document Reviewed: 12/16/2011 Elsevier Interactive Patient Education 2016 Else[employer] Inc[employer]  General Headache Without Cause A headache is pain or discomfort felt around the head or neck area. The specific cause of a headache may not be found. There are many causes and types of headaches. A few common ones are:  Tension headaches.  Migraine headaches.  Cluster headaches.  Chronic daily headaches. HOME CARE INSTRUCTIONS  Watch your condition for any changes. Take these steps to help with your condition: Managing Pain  Take over-the-counter and prescription medicines only as told by your health care provider.  Lie down in a dark, quiet room when you have a headache.  If directed, apply ice to the head and neck area:  Put ice in a plastic bag.  Place a towel between your skin and the bag.  Leave the ice on for 20 minutes, 2-3 times per day.  Use a heating pad or hot shower to apply heat to the head and neck area as told by your health care provider.  Keep lights dim if bright lights bother you or make your headaches worse. Eating and Drinking  Eat meals on a regular schedule.  Limit alcohol use.  Decrease the amount of caffeine you drink, or stop drinking caffeine. General Instructions  Keep all follow-up visits as told by your health care provider. This is important.  Keep a headache journal to help find out what may trigger your headaches. For example, write down:  What you eat and drink.  How much sleep you get.  Any change to your diet or medicines.  Try massage or other relaxation techniques.  Limit stress.  Sit up straight, and do not tense your muscles.  Do not use tobacco  products, including cigarettes, chewing tobacco, or e-cigarettes. If you need help quitting, ask your health care provider.  Exercise regularly as told by your health care provider.  Sleep on a regular schedule. Get 7-9 hours of sleep, or the amount recommended by your health care provider. SEEK MEDICAL CARE IF:   Your symptoms are not helped by medicine.  You have a headache that is different from the usual headache.  You have nausea or you vomit.  You have a fever. SEEK IMMEDIATE MEDICAL CARE IF:   Your headache becomes severe.  You have repeated vomiting.  You have a stiff neck.  You have a loss of vision.  You have problems with speech.  You have pain in the eye or ear.  You have muscular weakness or loss of muscle control.  You lose your balance or have trouble walking.  You feel faint or pass out.  You have confusion.   This information is not intended to replace advice given to you by your health care provider. Make sure you discuss any questions you have with your health  care provider.   Document Released: 10/17/2005 Document Revised: 07/08/2015 Document Reviewed: 02/09/2015 Elsevier Interactive Patient Education 2016 Else[employer] Inc.   Hyperventilation Hyperventilation is breathing that is deeper and more rapid than normal. It is usually associated with panic and anxiety. Hyperventilation can make you feel breathless. It is sometimes called overbreathing. Breathing out too much causes a decrease in the amount of carbon dioxide gas in the blood. This leads to tingling and numbness in the hands, feet, and around the mouth. If this continues, your fingers, hands, and toes may begin to spasm. Hyperventilation usually lasts 20-30 minutes and can be associated with other symptoms of panic and anxiety, including:   Chest pains or tightness.  A pounding or irregular, racing heartbeat (palpitations).  Dizziness.  Lightheadedness.  Dry  mouth.  Weakness.  Confusion.  Sleep disturbance. CAUSES  Sudden onset (acute) hyperventilation is usually triggered by acute stress, anxiety, or emotional upset. Long-term (chronic) and recurring hyperventilation can occur with chronic lung problems, such emphysema or asthma. Other causes include:   Nervousness.  Stress.  Stimulant, drug, or alcohol use.  Lung disease.  Infections, such as pneumonia.  Heart problems.  Severe pain.  Waking from a bad dream.  Pregnancy.  Bleeding. HOME CARE INSTRUCTIONS  Learn and use breathing exercises that help you breathe from your diaphragm and abdomen.  Practice relaxation techniques to reduce stress, such as visualization, meditation, and muscle release.  During an attack, try breathing into a paper bag. This changes the carbon dioxide level and slows down breathing. SEEK IMMEDIATE MEDICAL CARE IF:  Your hyperventilation continues or gets worse. MAKE SURE YOU:  Understand these instructions.  Will watch your condition.  Will get help right away if you are not doing well or get worse.   This information is not intended to replace advice given to you by your health care provider. Make sure you discuss any questions you have with your health care provider.   Document Released: 10/14/2000 Document Revised: 04/17/2012 Document Reviewed: 01/26/2012 Elsevier Interactive Patient Education [employer]2016 [employer] Inc[employer]

## 2016-01-12 NOTE — ED Notes (Signed)
Pt here with syncopal episode at work. Sitting at desk and fell forward with head onto desk.  Noticed by co-works.  Numbness/tingling in feet and arms.  No n/v/d.  Headache.  Denies chest pain.

## 2016-06-04 ENCOUNTER — Emergency Department (HOSPITAL_COMMUNITY): Payer: Medicaid Other

## 2016-06-04 ENCOUNTER — Emergency Department (HOSPITAL_COMMUNITY)
Admission: EM | Admit: 2016-06-04 | Discharge: 2016-06-04 | Disposition: A | Discharge status: Home / Self Care | Payer: Medicaid Other | Attending: Physician Assistant | Admitting: Physician Assistant

## 2016-06-04 ENCOUNTER — Encounter (HOSPITAL_COMMUNITY): Payer: Self-pay | Admitting: Emergency Medicine

## 2016-06-04 DIAGNOSIS — Y9389 Activity, other specified: Secondary | ICD-10-CM | POA: Insufficient documentation

## 2016-06-04 DIAGNOSIS — Z7982 Long term (current) use of aspirin: Secondary | ICD-10-CM | POA: Insufficient documentation

## 2016-06-04 DIAGNOSIS — M25562 Pain in left knee: Secondary | ICD-10-CM | POA: Insufficient documentation

## 2016-06-04 DIAGNOSIS — F17210 Nicotine dependence, cigarettes, uncomplicated: Secondary | ICD-10-CM | POA: Diagnosis not present

## 2016-06-04 DIAGNOSIS — R42 Dizziness and giddiness: Secondary | ICD-10-CM | POA: Insufficient documentation

## 2016-06-04 DIAGNOSIS — M25561 Pain in right knee: Secondary | ICD-10-CM | POA: Diagnosis not present

## 2016-06-04 DIAGNOSIS — Y92410 Unspecified street and highway as the place of occurrence of the external cause: Secondary | ICD-10-CM | POA: Insufficient documentation

## 2016-06-04 DIAGNOSIS — Y999 Unspecified external cause status: Secondary | ICD-10-CM | POA: Insufficient documentation

## 2016-06-04 DIAGNOSIS — M545 Low back pain: Secondary | ICD-10-CM | POA: Insufficient documentation

## 2016-06-04 LAB — CBC
HCT: 42.1 % (ref 39.0–52.0)
Hemoglobin: 13.9 g/dL (ref 13.0–17.0)
MCH: 27.4 pg (ref 26.0–34.0)
MCHC: 33 g/dL (ref 30.0–36.0)
MCV: 82.9 fL (ref 78.0–100.0)
Platelets: 323 10*3/uL (ref 150–400)
RBC: 5.08 MIL/uL (ref 4.22–5.81)
RDW: 14.9 % (ref 11.5–15.5)
WBC: 6.7 10*3/uL (ref 4.0–10.5)

## 2016-06-04 LAB — BASIC METABOLIC PANEL
Anion gap: 9 (ref 5–15)
BUN: 11 mg/dL (ref 6–20)
CO2: 24 mmol/L (ref 22–32)
Calcium: 9.2 mg/dL (ref 8.9–10.3)
Chloride: 106 mmol/L (ref 101–111)
Creatinine, Ser: 1.03 mg/dL (ref 0.61–1.24)
GFR calc Af Amer: >60 mL/min (ref >60)
GFR calc non Af Amer: >60 mL/min (ref >60)
Glucose, Bld: 101 mg/dL — ABNORMAL HIGH (ref 65–99)
Potassium: 3.5 mmol/L (ref 3.5–5.1)
Sodium: 139 mmol/L (ref 135–145)

## 2016-06-04 LAB — I-STAT TROPONIN, ED: Troponin i, poc: 0 ng/mL (ref 0.00–0.08)

## 2016-06-04 MED ORDER — IBUPROFEN 800 MG PO TABS: 800.0000 mg | ORAL_TABLET | Freq: Three times a day (TID) | ORAL | 0 refills | Status: DC

## 2016-06-04 MED ORDER — CYCLOBENZAPRINE HCL 10 MG PO TABS
10.0000 mg | ORAL_TABLET | Freq: Two times a day (BID) | ORAL | 0 refills | Status: DC | PRN
Start: 2016-06-04 — End: 2017-03-09

## 2016-06-04 MED ORDER — CYCLOBENZAPRINE HCL 10 MG PO TABS
10.0000 mg | ORAL_TABLET | Freq: Once | ORAL | Status: DC
  Filled 2016-06-04: qty 1

## 2016-06-04 MED ORDER — IBUPROFEN 800 MG PO TABS
800.0000 mg | ORAL_TABLET | Freq: Once | ORAL | Status: DC
  Filled 2016-06-04: qty 1

## 2016-06-04 NOTE — ED Notes (Signed)
Upon returning from x-ray, x-ray tech reported episode of dizziness upon standing. Aroused by verbal stimulation
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
## 2016-10-20 DIAGNOSIS — G4733 Obstructive sleep apnea (adult) (pediatric): Secondary | ICD-10-CM | POA: Insufficient documentation

## 2016-10-20 DIAGNOSIS — H409 Unspecified glaucoma: Secondary | ICD-10-CM | POA: Insufficient documentation

## 2016-10-20 DIAGNOSIS — E785 Hyperlipidemia, unspecified: Secondary | ICD-10-CM | POA: Insufficient documentation

## 2016-10-20 DIAGNOSIS — I10 Essential (primary) hypertension: Secondary | ICD-10-CM | POA: Insufficient documentation

## 2016-10-20 DIAGNOSIS — M1990 Unspecified osteoarthritis, unspecified site: Secondary | ICD-10-CM | POA: Insufficient documentation

## 2016-10-20 DIAGNOSIS — R7303 Prediabetes: Secondary | ICD-10-CM | POA: Insufficient documentation

## 2016-10-31 HISTORY — PX: COLONOSCOPY: SHX174

## 2016-11-24 DIAGNOSIS — Z6841 Body Mass Index (BMI) 40.0 and over, adult: Secondary | ICD-10-CM

## 2016-12-29 DIAGNOSIS — N50811 Right testicular pain: Secondary | ICD-10-CM | POA: Insufficient documentation

## 2017-03-09 ENCOUNTER — Encounter (INDEPENDENT_AMBULATORY_CARE_PROVIDER_SITE_OTHER): Payer: Self-pay

## 2017-03-09 ENCOUNTER — Encounter: Payer: Self-pay | Admitting: Neurology

## 2017-03-09 ENCOUNTER — Ambulatory Visit (INDEPENDENT_AMBULATORY_CARE_PROVIDER_SITE_OTHER): Payer: Medicaid Other | Admitting: Family

## 2017-03-09 ENCOUNTER — Ambulatory Visit (INDEPENDENT_AMBULATORY_CARE_PROVIDER_SITE_OTHER): Payer: Medicaid Other

## 2017-03-09 ENCOUNTER — Encounter (INDEPENDENT_AMBULATORY_CARE_PROVIDER_SITE_OTHER): Payer: Self-pay | Admitting: Family

## 2017-03-09 ENCOUNTER — Ambulatory Visit (INDEPENDENT_AMBULATORY_CARE_PROVIDER_SITE_OTHER): Payer: Medicaid Other | Admitting: Neurology

## 2017-03-09 VITALS — Ht 65.0 in | Wt 344.0 lb

## 2017-03-09 VITALS — BP 160/72 | HR 88 | Resp 22 | Ht 65.0 in | Wt 344.0 lb

## 2017-03-09 DIAGNOSIS — R400 Somnolence: Secondary | ICD-10-CM

## 2017-03-09 DIAGNOSIS — M25561 Pain in right knee: Secondary | ICD-10-CM

## 2017-03-09 DIAGNOSIS — R519 Headache, unspecified: Secondary | ICD-10-CM

## 2017-03-09 DIAGNOSIS — G4733 Obstructive sleep apnea (adult) (pediatric): Secondary | ICD-10-CM

## 2017-03-09 DIAGNOSIS — R635 Abnormal weight gain: Secondary | ICD-10-CM

## 2017-03-09 DIAGNOSIS — Z6841 Body Mass Index (BMI) 40.0 and over, adult: Secondary | ICD-10-CM | POA: Diagnosis not present

## 2017-03-09 DIAGNOSIS — Z820 Family history of epilepsy and other diseases of the nervous system: Secondary | ICD-10-CM

## 2017-03-09 DIAGNOSIS — G8929 Other chronic pain: Secondary | ICD-10-CM

## 2017-03-09 DIAGNOSIS — R51 Headache: Secondary | ICD-10-CM

## 2017-03-09 DIAGNOSIS — M1711 Unilateral primary osteoarthritis, right knee: Secondary | ICD-10-CM

## 2017-03-09 MED ORDER — LIDOCAINE HCL 1 % IJ SOLN
5.0000 mL | INTRAMUSCULAR | Status: AC | PRN
  Administered 2017-03-09: 5 mL

## 2017-03-09 MED ORDER — METHYLPREDNISOLONE ACETATE 40 MG/ML IJ SUSP
40.0000 mg | INTRAMUSCULAR | Status: AC | PRN
  Administered 2017-03-09: 40 mg via INTRA_ARTICULAR

## 2017-03-09 NOTE — Patient Instructions (Signed)
Based on your symptoms and your exam I believe you are still at risk for obstructive sleep apnea or OSA, and I think we should proceed with a sleep study to determine how severe it is. If you have more than mild OSA, I want you to consider treatment with CPAP. Please remember, the risks and ramifications of moderate to severe obstructive sleep apnea or OSA are: Cardiovascular disease, including congestive heart failure, stroke, difficult to control hypertension, arrhythmias, and even type 2 diabetes has been linked to untreated OSA. Sleep apnea causes disruption of sleep and sleep deprivation in most cases, which, in turn, can cause recurrent headaches, problems with memory, mood, concentration, focus, and vigilance. Most people with untreated sleep apnea report excessive daytime sleepiness, which can affect their ability to drive. Please do not drive if you feel sleepy.   I will likely see you back after your sleep study to go over the test results and where to go from there. We will call you after your sleep study to advise about the results (most likely, you will hear from Diana, my nurse) and to set up an appointment at the time, as necessary.    Our sleep lab administrative assistant, Dawn will meet with you or call you to schedule your sleep study. If you don't hear back from her by next week please feel free to call her at [phone]. This is her direct line and please leave a message with your phone number to call back if you get the voicemail box. She will call back as soon as possible.   

## 2017-03-09 NOTE — Progress Notes (Addendum)
Subjective:    Patient ID: [name] is a 46 y.o. male.  HPI    Huston Foley, MD, PhD [facility] [address], [address] [address] [place], [place] [address]  Dear Dr. Claudette Laws,  I saw your patient, [name], upon your kind request in my neurologic clinic today for initial consultation of his sleep disorder, in particular evaluation of his OSA. The patient is unaccompanied today. As you know, [name] is a 46 year old right-handed gentleman with an underlying medical history of osteoarthritis, hypertension, hyperlipidemia, prediabetes, glaucoma, plantar fasciitis, and morbid obesity with BMI of over 55, who was previously diagnosed with obstructive sleep apnea and placed on CPAP therapy. I reviewed your office note from 11/24/2016, which you kindly included. He had a sleep study on 01/12/2015 which I reviewed. Interpreter was Dr. Jetty Duhamel. Sleep efficiency was 77.5%, sleep latency was 80 minutes, REM latency was 220 minutes. He had an increased percentage of stage I and stage II sleep, REM sleep at 10.6%. Total AHI was 65.4 per hour, no significant PLMS, average oxygen saturation was 97.1%, nadir was 89%. His original sleep apnea diagnosis dates back several years prior. He was noncompliant with CPAP therapy at the time, according to prior records. He has not used CPAP in over 2 years, he reports that the machine broke. He would like to consider treatment again with CPAP therapy. Of note, he has gained nearly 60 pounds since his sleep study in March 2016. He reports bedtime between 11 PM and midnight, he has a wake up time of 5:30 AM. He denies nocturia but has had occasional morning headaches. His weight has been fluctuating. He saw ENT some 5 years ago or so and was told that he could have airway surgery but he did not pursue it. He is a nonsmoker and drinks alcohol infrequently maybe twice a month or so. He smokes cigars maybe twice a year. He drinks  caffeine maybe once or twice per week. He lives at home with his wife and 5 children, ages 40-18. He's currently not working but will be starting work with [employer] as I understand. His father has sleep apnea and his sister has sleep apnea. He denies frank restless leg symptoms but reports being a restless sleeper and some residual carpal tunnel symptoms on the left. He had surgery for this. His Epworth sleepiness score is 14 out of 24, fatigue score is 42 out of 63.  His Past Medical History Is Significant For: Past Medical History:  Diagnosis Date  . Anginal pain (HCC)    04/2011 pt had agina, seen by cardiologist, no further treatment  . High cholesterol   . Hypertension     His Past Surgical History Is Significant For: Past Surgical History:  Procedure Laterality Date  . TONSILLECTOMY      His Family History Is Significant For: Family History  Problem Relation Age of Onset  . Diabetes Mother   . Hypertension Mother   . Hypertension Father   . Diabetes Father     His Social History Is Significant For: Social History   Social History  . Marital status: Married    Spouse name: N/A  . Number of children: 5  . Years of education: BA   Occupational History  . N/A    Social History Main Topics  . Smoking status: Light Tobacco Smoker    Types: Cigars  . Smokeless tobacco: Never Used  . Alcohol use No  .  Drug use: No  . Sexual activity: Not Asked   Other Topics Concern  . None   Social History Narrative   Denies caffeine use    His Allergies Are:  No Known Allergies:   His Current Medications Are:  Outpatient Encounter Prescriptions as of 03/09/2017  Medication Sig  . aspirin EC 81 MG tablet Take 81 mg by mouth every morning.  Marland Kitchen atorvastatin (LIPITOR) 40 MG tablet   . hydrochlorothiazide (HYDRODIURIL) 25 MG tablet   . [DISCONTINUED] cyclobenzaprine (FLEXERIL) 10 MG tablet Take 1 tablet (10 mg total) by mouth 2 (two) times daily as needed for muscle  spasms.  . [DISCONTINUED] erythromycin ophthalmic ointment Place a 1/2 inch ribbon of ointment into the lower eyelid of left eye twice daily  x 1 week.  . [DISCONTINUED] ibuprofen (ADVIL,MOTRIN) 800 MG tablet Take 1 tablet (800 mg total) by mouth 3 (three) times daily.  . [DISCONTINUED] Multiple Vitamin (MULTIVITAMIN WITH MINERALS) TABS tablet Take 1 tablet by mouth daily.   No facility-administered encounter medications on file as of 03/09/2017.   :  Review of Systems:  Out of a complete 14 point review of systems, all are reviewed and negative with the exception of these symptoms as listed below: Review of Systems  Neurological:       Patient has had a sleep study in the past. Prescribed CPAP. Have not use CPAP in 2 years. Patient reports gaining weight since last sleep study.   Patient wakes up feeling tired, daytime fatigue, morning headaches, snores, witnessed apnea.    Epworth Sleepiness Scale 0= would never doze 1= slight chance of dozing 2= moderate chance of dozing 3= high chance of dozing  Sitting and reading:3 Watching TV:3 Sitting inactive in a public place (ex. Theater or meeting):3 As a passenger in a car for an hour without a break:0 Lying down to rest in the afternoon:3 Sitting and talking to someone:0 Sitting quietly after lunch (no alcohol):2 In a car, while stopped in traffic:0 Total:14  Objective:  Neurologic Exam  Physical Exam Physical Examination:   Vitals:   03/09/17 1302  BP: (!) 160/72  Pulse: 88  Resp: (!) 22    General Examination: The patient is a very pleasant 46 y.o. male in no acute distress. He appears well-developed and well-nourished and well groomed.   HEENT: Normocephalic, atraumatic, pupils are equal, round and reactive to light and accommodation. Extraocular tracking is good without limitation to gaze excursion or nystagmus noted. Normal smooth pursuit is noted. Hearing is grossly intact. Tympanic membranes are clear bilaterally.  Face is symmetric with normal facial animation and normal facial sensation. Speech is clear with no dysarthria noted. There is no hypophonia. There is no lip, neck/head, jaw or voice tremor. Neck is supple with full range of passive and active motion. There are no carotid bruits on auscultation. Oropharynx exam reveals: mild mouth dryness, adequate dental hygiene and marked airway crowding, due to large tongue, larger and swollen appearing uvula and tonsils in place about 2+. Mallampati is class III. Tongue protrudes centrally and palate elevates symmetrically. Neck size is 22.75 inches. He has a Mild overbite.   Chest: Clear to auscultation without wheezing, rhonchi or crackles noted.  Heart: S1+S2+0, regular and normal without murmurs, rubs or gallops noted.   Abdomen: Soft, non-tender and non-distended with normal bowel sounds appreciated on auscultation.  Extremities: There is 1+ pitting edema in the distal lower extremities bilaterally. Pedal pulses are intact.  Skin: Warm and dry without trophic changes  noted.  Musculoskeletal: exam reveals no obvious joint deformities, tenderness or joint swelling or erythema.   Neurologically:  Mental status: The patient is awake, alert and oriented in all 4 spheres. His immediate and remote memory, attention, language skills and fund of knowledge are appropriate. There is no evidence of aphasia, agnosia, apraxia or anomia. Speech is clear with normal prosody and enunciation. Thought process is linear. Mood is normal and affect is blunted.  Cranial nerves II - XII are as described above under HEENT exam. In addition: shoulder shrug is normal with equal shoulder height noted. Motor exam: Normal bulk, strength and tone is noted. There is no drift, tremor or rebound. Romberg is not tested for safety. Reflexes are 1+ in the UEs and absent in the LEs. Fine motor skills and coordination: intact with normal finger taps, normal hand movements, normal rapid  alternating patting, normal foot taps and normal foot agility.  Cerebellar testing: No dysmetria or intention tremor on finger to nose testing. Heel to shin is unremarkable bilaterally. There is no truncal or gait ataxia.  Sensory exam: intact to light touch in the upper and lower extremities.  Gait, station and balance: He stands with difficulty. No veering to one side is noted. No leaning to one side is noted. Posture is age-appropriate and stance is narrow based. Gait shows slow gait, tandem walk is not tested for safety.   Assessment and Plan:  In summary, JAVONI LUCKEN is a  46 y.o.-year old male with an underlying medical history of osteoarthritis, hypertension, hyperlipidemia, prediabetes, glaucoma, CTS, plantar fasciitis, and morbid obesity with BMI of over 55, who presents for consultation of his obstructive sleep apnea (OSA). I had a long chat with the patient about my findings and the diagnosis of OSA, its prognosis and treatment options. We talked about medical treatments, surgical interventions and non-pharmacological approaches. I explained in particular the risks and ramifications of untreated moderate to severe OSA, especially with respect to developing cardiovascular disease down the Road, including congestive heart failure, difficult to treat hypertension, cardiac arrhythmias, or stroke. Even type 2 diabetes has, in part, been linked to untreated OSA. Symptoms of untreated OSA include daytime sleepiness, memory problems, mood irritability and mood disorder such as depression and anxiety, lack of energy, as well as recurrent headaches, especially morning headaches. We talked about trying to maintain a healthy lifestyle in general, as well as the importance of weight control. I encouraged the patient to eat healthy, exercise daily and keep well hydrated, to keep a scheduled bedtime and wake time routine, to not skip any meals and eat healthy snacks in between meals. I advised the patient  not to drive when feeling sleepy. I recommended the following at this time: sleep study with potential positive airway pressure titration. (We will score hypopneas at 4%).   I explained the sleep test procedure to the patient and also outlined possible surgical and non-surgical treatment options of OSA, including the use of a custom-made dental device (which would require a referral to a specialist dentist or oral surgeon), upper airway surgical options, such as pillar implants, radiofrequency surgery, tongue base surgery, and UPPP (which would involve a referral to an ENT surgeon). Rarely, jaw surgery such as mandibular advancement may be considered.  I also explained the CPAP treatment option to the patient, who indicated that he would be willing to try CPAP again if the need arises. I explained the importance of being compliant with PAP treatment, not only for insurance purposes but  primarily to improve His symptoms, and for the patient's long term health benefit, including to reduce His cardiovascular risks. I did remind patient that it is very important that he stay compliant with treatment as this is the third time he is going to be evaluated for sleep apnea and be considered for CPAP treatment.we may also want to consult with ENT again. His airway is fairly crowded and pressure requirement for his CPAP at the time was I believe 16 or 17 cm. This may have become worse since his nearly 60 pound weight gain. I answered all his questions today and the patient was in agreement. I would like to see him back after the sleep study is completed and encouraged him to call with any interim questions, concerns, problems or updates.   Thank you very much for allowing me to participate in the care of this nice patient. If I can be of any further assistance to you please do not hesitate to call me at [phone].  Sincerely,   Huston FoleySaima Esequiel Kleinfelter, MD, PhD

## 2017-03-09 NOTE — Progress Notes (Signed)
Office Visit Note   Patient: Duane PalauRonald M Dazey           Date of Birth: 10/07/1971           MRN: [id number] Visit Date: 03/09/2017              Requested by: Leilani Ableeese, Betti, MD [phone] [place] [id number] PCP: Leilani Ableeese, Betti, MD  Chief Complaint  Patient presents with  . Right Knee - Pain      HPI: The patient is a 46 year old gentleman who presents today complaining of right knee pain. States he has medial sidE swelling as well. Complaining of excruciating pain pain with start up pain with ambulation. States he stands for prolonged period of time his knee "goes numb." Fears his knee will give out. Has not had any mechanical symptoms.  Assessment & Plan: Visit Diagnoses:  1. Chronic pain of right knee     Plan: provided a hinged knee brace today for stability. Depomedrol injection today. Will follow up in office in 4 weeks if continued symptoms.   Follow-Up Instructions: No Follow-up on file.   Ortho Exam  Patient is alert, oriented, no adenopathy, well-dressed, normal affect, normal respiratory effort. Does have an antalgic gait. Has full range of motion of his knee. There is medial and lateral joint line tenderness. Knee stable to varus and valgus stress there is no effusion. No erythema no redness  Imaging: Xr Knee 1-2 Views Right  Result Date: 03/09/2017 Radiographs of right knee show mild joint space narrowing. No acute finding.    Labs: Lab Results  Component Value Date   HGBA1C (H) 11/11/2010    6.0 (NOTE)                                                                       According to the ADA Clinical Practice Recommendations for 2011, when HbA1c is used as a screening test:   >=6.5%   Diagnostic of Diabetes Mellitus           (if abnormal result  is confirmed)  5.7-6.4%   Increased risk of developing Diabetes Mellitus  References:Diagnosis and Classification of Diabetes Mellitus,Diabetes Care,2011,34(Suppl 1):S62-S69 and Standards of  Medical Care in         Diabetes - 2011,Diabetes Care,2011,34  (Suppl 1):S11-S61.    Orders:  Orders Placed This Encounter  Procedures  . XR Knee 1-2 Views Right   No orders of the defined types were placed in this encounter.    Procedures: Large Joint Inj Date/Time: 03/09/2017 2:59 PM Performed by: Adonis HugueninZAMORA, Ximena Todaro R Authorized by: Barnie DelZAMORA, Amiyrah Lamere R   Consent Given by:  Patient Site marked: the procedure site was marked   Timeout: prior to procedure the correct patient, procedure, and site was verified   Indications:  Pain and diagnostic evaluation Location:  Knee Site:  R knee Prep: patient was prepped and draped in usual sterile fashion   Needle Size:  22 G Needle Length:  1.5 inches Ultrasound Guidance: No   Fluoroscopic Guidance: No   Arthrogram: No   Medications:  5 mL lidocaine 1 %; 40 mg methylPREDNISolone acetate 40 MG/ML Aspiration Attempted: No   Patient tolerance:  Patient tolerated the procedure well  with no immediate complications     Clinical Data: No additional findings.  ROS:  All other systems negative, except as noted in the HPI. Review of Systems  Constitutional: Negative for chills and fever.  Musculoskeletal: Positive for arthralgias, gait problem and joint swelling.  Neurological: Negative for weakness.    Objective: Vital Signs: Ht 5\' 5"  (1.651 m)   Wt (!) 344 lb (156 kg)   BMI 57.24 kg/m   Specialty Comments:  No specialty comments available.  PMFS History: There are no active problems to display for this patient.  Past Medical History:  Diagnosis Date  . Anginal pain (HCC)    04/2011 pt had agina, seen by cardiologist, no further treatment  . High cholesterol   . Hypertension     Family History  Problem Relation Age of Onset  . Diabetes Mother   . Hypertension Mother   . Hypertension Father   . Diabetes Father     Past Surgical History:  Procedure Laterality Date  . TONSILLECTOMY     Social History   Occupational  History  . N/A    Social History Main Topics  . Smoking status: Light Tobacco Smoker    Types: Cigars  . Smokeless tobacco: Never Used  . Alcohol use No  . Drug use: No  . Sexual activity: Not on file

## 2017-03-20 ENCOUNTER — Ambulatory Visit (INDEPENDENT_AMBULATORY_CARE_PROVIDER_SITE_OTHER): Payer: Medicaid Other | Admitting: Neurology

## 2017-03-20 DIAGNOSIS — G4720 Circadian rhythm sleep disorder, unspecified type: Secondary | ICD-10-CM

## 2017-03-20 DIAGNOSIS — G4733 Obstructive sleep apnea (adult) (pediatric): Secondary | ICD-10-CM | POA: Diagnosis not present

## 2017-03-24 NOTE — Addendum Note (Signed)
Addended by: Huston FoleyATHAR, Jadence Kinlaw on: 03/24/2017 01:23 PM   Modules accepted: Orders

## 2017-03-24 NOTE — Progress Notes (Signed)
[name]:  Patient referred by Dr. Gerald StabsKaitlyn Watson, seen by me on 03/09/17, split study on 03/20/17. Please call and notify patient that the recent sleep study confirmed the diagnosis of severe OSA. He did well with CPAP during the study with significant improvement of the respiratory events. Therefore, I would like start the patient on CPAP therapy at home by prescribing a machine for home use. I placed the order in the chart. The patient will need a follow up appointment with me in 10 weeks post set up that has to be scheduled; please go ahead and schedule while you have the patient on the phone and make sure patient understands the importance of keeping this window for the FU appointment, as it is often an insurance requirement and failing to adhere to this may result in losing coverage for sleep apnea treatment.  Please re-enforce the importance of compliance with treatment and the need for us to monitor compliance data - again an insurance requirement and good feedback for the patient as far as how they are doing.  Also remind patient, that any upcoming CPAP machine or mask issues, should be first addressed with the DME company. Please ask if patient has a preference regarding DME company.  Please arrange for CPAP set up at home through a DME company of patient's choice - once you have spoken to the patient - and faxed/routed report to PCP and referring MD (if other than PCP), you can close this encounter, thanks,   Huston FoleySaima Kristof Nadeem, MD, PhD [facility] ([facility])

## 2017-03-24 NOTE — Procedures (Signed)
PATIENT'S NAME:  [name], [name] DOB:      1971-10-11      MR#:    [id number]     DATE OF RECORDING: 03/20/2017 REFERRING M.D.:  Leilani Able, MD Study Performed:  Split-Night Titration Study HISTORY: 46 year old man with a history of osteoarthritis, hypertension, hyperlipidemia, prediabetes, glaucoma, plantar fasciitis, and morbid obesity with BMI of over 55, who was previously diagnosed with obstructive sleep apnea and presents for re-evaluation and treatment. The patient endorsed the Epworth Sleepiness Scale at 14/24 points. The patient's weight 344 pounds with a height of 65 (inches), resulting in a BMI of 57.3 kg/m2. The patient's neck circumference measured 22.8 inches.  CURRENT MEDICATIONS: Aspirin, Atorvastatin and Hydrochlorothiazide  PROCEDURE:  This is a multichannel digital polysomnogram utilizing the Somnostar 11.2 system.  Electrodes and sensors were applied and monitored per AASM Specifications.   EEG, EOG, Chin and Limb EMG, were sampled at 200 Hz.  ECG, Snore and Nasal Pressure, Thermal Airflow, Respiratory Effort, CPAP Flow and Pressure, Oximetry was sampled at 50 Hz. Digital video and audio were recorded.      BASELINE STUDY WITHOUT CPAP RESULTS:  Lights Out was at 22:26 and Lights On at 05:11 for the night, split study start at 01:02, epoch 330. Total recording time (TRT) was 170.5, with a total sleep time (TST) of 132 minutes.   The patient's sleep latency was 20 minutes. REM was absent.  The sleep efficiency was 77.4 %.    SLEEP ARCHITECTURE: WASO (Wake after sleep onset) was 19 minutes with mild sleep fragmentation noted, Stage N1 was 11.5 minutes, Stage N2 was 120.5 minutes, Stage N3 was 0 minutes and Stage R (REM sleep) was 0 minutes.  The percentages were Stage N1 8.7%, Stage N2 91.3%, Stage N3 and Stage R (REM sleep) were absent. The arousals were noted as: 6 were spontaneous, 0 were associated with PLMs, 176 were associated with respiratory events.   Audio and video  analysis did not show any abnormal or unusual movements, behaviors, phonations or vocalizations.  The patient took 1 bathroom break for the night. Moderate to loud snoring was noted. The EKG was in keeping with normal sinus rhythm (NSR)  RESPIRATORY ANALYSIS:  There were a total of 175 respiratory events:  75 obstructive apneas, 0 central apneas and 0 mixed apneas with a total of 75 apneas and an apnea index (AI) of 34.1. There were 100 hypopneas with a hypopnea index of 45.5. The patient also had 0 respiratory event related arousals (RERAs).  Snoring was noted.     The total APNEA/HYPOPNEA INDEX (AHI) was 79.5 /hour and the total RESPIRATORY DISTURBANCE INDEX was 79.5 /hour.  0 events occurred in REM sleep and 200 events in NREM. The REM AHI was n/a,  non-REM AHI of 79.5 /hour. The patient spent 95.5 minutes sleep time in the supine position 259 minutes in non-supine. The supine AHI was 94.7 /hour versus a non-supine AHI of 78.3 /hour.  OXYGEN SATURATION & C02:  The wake baseline 02 saturation was 93%, with the lowest being 86%. Time spent below 89% saturation equaled 7 minutes.  PERIODIC LIMB MOVEMENTS: The patient had a total of 0 Periodic Limb Movements.  The Periodic Limb Movement (PLM) index was 0 /hour and the PLM Arousal index was 0 /hour.  TITRATION STUDY WITH CPAP RESULTS:   The patient was fitted with a medium Simplus FFM, CPAP was initiated at 8 cmH20 (due to prior experience with CPAP and previous titrated pressure of 16 cm  in the past) with heated humidity per AASM split night standards and pressure was advanced to 12 cmH20 because of hypopneas, apneas and desaturations.  At a PAP pressure of 12 cmH20, there was a reduction of the AHI to 0/hour, non-supine REM sleep achieved, O2 nadir of 92%.  Total recording time (TRT) was 235 minutes, with a total sleep time (TST) of 222 minutes. The patient's sleep latency was 6 minutes. REM latency was 6 minutes.  The sleep efficiency was 94.5 %.     SLEEP ARCHITECTURE: Wake after sleep was 7 minutes, Stage N1 6 minutes, Stage N2 137 minutes, Stage N3 19 minutes and Stage R (REM sleep) 60 minutes. The percentages were: Stage N1 2.7%, Stage N2 61.7%, Stage N3 8.6% and Stage R (REM sleep) 27.%.   The arousals were noted as: 15 were spontaneous, 0 were associated with PLMs, 8 were associated with respiratory events.  RESPIRATORY ANALYSIS:  There were a total of 8 respiratory events: 0 obstructive apneas, 0 central apneas and 0 mixed apneas with a total of 0 apneas and an apnea index (AI) of 0. There were 8 hypopneas with a hypopnea index of 2.2 /hour. The patient also had 0 respiratory event related arousals (RERAs).      The total APNEA/HYPOPNEA INDEX  (AHI) was 2.2 /hour and the total RESPIRATORY DISTURBANCE INDEX was 2.2 /hour.  3 events occurred in REM sleep and 5 events in NREM. The REM AHI was 3 /hour versus a non-REM AHI of 1.9 /hour. REM sleep was achieved on a pressure of  cm/h2o (AHI was  .) The patient spent 39% of total sleep time in the supine position. The supine AHI was 2.8 /hour, versus a non-supine AHI of 1.8/hour.  OXYGEN SATURATION & C02:  The wake baseline 02 saturation was 94%, with the lowest being 90%. Time spent below 89% saturation equaled 0 minutes.  PERIODIC LIMB MOVEMENTS:  The patient had a total of 0 Periodic Limb Movements. The Periodic Limb Movement (PLM) index was 0 /hour and the PLM Arousal index was 0 /hour.  Post-study, the patient indicated that sleep was better than usual.  POLYSOMNOGRAPHY IMPRESSION :   1. Obstructive Sleep Apnea (OSA)  2. Dysfunctions associated with sleep stages or arousals from sleep  RECOMMENDATIONS:  1. This patient has severe obstructive sleep apnea and responded well on CPAP therapy. I will, therefore, start the patient on home CPAP treatment at a pressure of 12 cm via medium FFM, with heated humidity. The patient should be reminded to be fully compliant with PAP therapy to  improve sleep related symptoms and decrease long term cardiovascular risks. Please note that untreated obstructive sleep apnea carries additional perioperative morbidity. Patients with significant obstructive sleep apnea should receive perioperative PAP therapy and the surgeons and particularly the anesthesiologist should be informed of the diagnosis and the severity of the sleep disordered breathing. 2. Other treatment options for OSA (generally speaking) may include avoidance of supine sleep position along with weight loss, upper airway or jaw surgery in selected patients or the use of an oral appliance in certain patients. ENT evaluation and/or consultation with a maxillofacial surgeon or dentist may be feasible in some instances.    3. This study shows sleep fragmentation and abnormal sleep stage percentages; these are nonspecific findings and per se do not signify an intrinsic sleep disorder or a cause for the patient's sleep-related symptoms. Causes include (but are not limited to) the first night effect of the sleep study, circadian rhythm  disturbances, medication effect or an underlying mood disorder or medical problem.  4. The patient should be cautioned not to drive, work at heights, or operate dangerous or heavy equipment when tired or sleepy. Review and reiteration of good sleep hygiene measures should be pursued with any patient. 5. The patient will be seen in follow-up by Dr. Frances Furbish at [facility] for discussion of the test results and further management strategies. The referring provider will be notified of the test results.  I certify that I have reviewed the entire raw data recording prior to the issuance of this report in accordance with the Standards of Accreditation of the American Academy of Sleep Medicine (AASM)   Huston Foley, MD, PhD Diplomat, American Board of Psychiatry and Neurology (Neurology and Sleep Medicine)

## 2017-03-28 ENCOUNTER — Telehealth: Payer: Self-pay

## 2017-03-28 NOTE — Telephone Encounter (Signed)
-----   Message from Huston FoleySaima Athar, MD sent at 03/24/2017  1:23 PM EDT ----- Lafonda Mossesiana:  Patient referred by Dr. Gerald StabsKaitlyn Watson, seen by me on 03/09/17, split study on 03/20/17. Please call and notify patient that the recent sleep study confirmed the diagnosis of severe OSA. He did well with CPAP during the study with significant improvement of the respiratory events. Therefore, I would like start the patient on CPAP therapy at home by prescribing a machine for home use. I placed the order in the chart. The patient will need a follow up appointment with me in 10 weeks post set up that has to be scheduled; please go ahead and schedule while you have the patient on the phone and make sure patient understands the importance of keeping this window for the FU appointment, as it is often an insurance requirement and failing to adhere to this may result in losing coverage for sleep apnea treatment.  Please re-enforce the importance of compliance with treatment and the need for us to monitor compliance data - again an insurance requirement and good feedback for the patient as far as how they are doing.  Also remind patient, that any upcoming CPAP machine or mask issues, should be first addressed with the DME company. Please ask if patient has a preference regarding DME company.  Please arrange for CPAP set up at home through a DME company of patient's choice - once you have spoken to the patient - and faxed/routed report to PCP and referring MD (if other than PCP), you can close this encounter, thanks,   Huston FoleySaima Athar, MD, PhD [facility] ([facility]

## 2017-03-28 NOTE — Telephone Encounter (Signed)
I spoke to patient and he is aware of results and recommendations. He is willing to start treatment. I will send orders to [facility] per patient request. He was able to make f/u appt. I will send report to PCP.

## 2017-05-17 ENCOUNTER — Ambulatory Visit (INDEPENDENT_AMBULATORY_CARE_PROVIDER_SITE_OTHER): Payer: Medicaid Other | Admitting: Family

## 2017-06-09 ENCOUNTER — Emergency Department (HOSPITAL_COMMUNITY)
Admission: EM | Admit: 2017-06-09 | Discharge: 2017-06-09 | Disposition: A | Discharge status: Home / Self Care | Payer: Medicaid Other | Attending: Emergency Medicine | Admitting: Emergency Medicine

## 2017-06-09 ENCOUNTER — Emergency Department (HOSPITAL_COMMUNITY): Payer: Medicaid Other

## 2017-06-09 ENCOUNTER — Encounter (HOSPITAL_COMMUNITY): Payer: Self-pay | Admitting: Emergency Medicine

## 2017-06-09 DIAGNOSIS — M542 Cervicalgia: Secondary | ICD-10-CM | POA: Insufficient documentation

## 2017-06-09 DIAGNOSIS — R0789 Other chest pain: Secondary | ICD-10-CM | POA: Diagnosis not present

## 2017-06-09 DIAGNOSIS — Z7982 Long term (current) use of aspirin: Secondary | ICD-10-CM | POA: Insufficient documentation

## 2017-06-09 DIAGNOSIS — R42 Dizziness and giddiness: Secondary | ICD-10-CM | POA: Diagnosis not present

## 2017-06-09 DIAGNOSIS — I10 Essential (primary) hypertension: Secondary | ICD-10-CM | POA: Insufficient documentation

## 2017-06-09 DIAGNOSIS — R51 Headache: Secondary | ICD-10-CM | POA: Insufficient documentation

## 2017-06-09 DIAGNOSIS — H538 Other visual disturbances: Secondary | ICD-10-CM | POA: Insufficient documentation

## 2017-06-09 DIAGNOSIS — R200 Anesthesia of skin: Secondary | ICD-10-CM | POA: Diagnosis not present

## 2017-06-09 DIAGNOSIS — R519 Headache, unspecified: Secondary | ICD-10-CM

## 2017-06-09 DIAGNOSIS — R0602 Shortness of breath: Secondary | ICD-10-CM | POA: Diagnosis not present

## 2017-06-09 DIAGNOSIS — Z79899 Other long term (current) drug therapy: Secondary | ICD-10-CM | POA: Diagnosis not present

## 2017-06-09 DIAGNOSIS — F17210 Nicotine dependence, cigarettes, uncomplicated: Secondary | ICD-10-CM | POA: Diagnosis not present

## 2017-06-09 LAB — I-STAT TROPONIN, ED
Troponin i, poc: 0.01 ng/mL (ref 0.00–0.08)
Troponin i, poc: 0 ng/mL (ref 0.00–0.08)

## 2017-06-09 LAB — BASIC METABOLIC PANEL
Anion gap: 9 (ref 5–15)
BUN: 12 mg/dL (ref 6–20)
CO2: 28 mmol/L (ref 22–32)
Calcium: 9.2 mg/dL (ref 8.9–10.3)
Chloride: 104 mmol/L (ref 101–111)
Creatinine, Ser: 1.06 mg/dL (ref 0.61–1.24)
GFR calc Af Amer: >60 mL/min (ref >60)
GFR calc non Af Amer: >60 mL/min (ref >60)
Glucose, Bld: 122 mg/dL — ABNORMAL HIGH (ref 65–99)
Potassium: 3.7 mmol/L (ref 3.5–5.1)
Sodium: 141 mmol/L (ref 135–145)

## 2017-06-09 LAB — CBC
HCT: 41 % (ref 39.0–52.0)
Hemoglobin: 14.2 g/dL (ref 13.0–17.0)
MCH: 28 pg (ref 26.0–34.0)
MCHC: 34.6 g/dL (ref 30.0–36.0)
MCV: 80.7 fL (ref 78.0–100.0)
Platelets: 333 K/uL (ref 150–400)
RBC: 5.08 MIL/uL (ref 4.22–5.81)
RDW: 15 % (ref 11.5–15.5)
WBC: 6.8 K/uL (ref 4.0–10.5)

## 2017-06-09 MED ORDER — IOPAMIDOL (ISOVUE-370) INJECTION 76%
INTRAVENOUS | Status: AC
  Filled 2017-06-09: qty 100

## 2017-06-09 MED ORDER — IOPAMIDOL (ISOVUE-370) INJECTION 76%
100.0000 mL | Freq: Once | INTRAVENOUS | Status: AC | PRN
  Administered 2017-06-09: 100 mL via INTRAVENOUS

## 2017-06-09 MED ORDER — METOCLOPRAMIDE HCL 5 MG/ML IJ SOLN
10.0000 mg | Freq: Once | INTRAMUSCULAR | Status: AC
  Administered 2017-06-09: 10 mg via INTRAVENOUS
  Filled 2017-06-09: qty 2

## 2017-06-09 MED ORDER — DIPHENHYDRAMINE HCL 50 MG/ML IJ SOLN
INTRAMUSCULAR | Status: AC
  Filled 2017-06-09: qty 1

## 2017-06-09 MED ORDER — SODIUM CHLORIDE 0.9 % IV BOLUS (SEPSIS)
1000.0000 mL | Freq: Once | INTRAVENOUS | Status: AC
  Administered 2017-06-09: 1000 mL via INTRAVENOUS

## 2017-06-09 MED ORDER — DEXAMETHASONE SODIUM PHOSPHATE 10 MG/ML IJ SOLN
10.0000 mg | Freq: Once | INTRAMUSCULAR | Status: AC
  Administered 2017-06-09: 10 mg via INTRAVENOUS
  Filled 2017-06-09: qty 1

## 2017-06-09 MED ORDER — DIPHENHYDRAMINE HCL 50 MG/ML IJ SOLN
25.0000 mg | Freq: Once | INTRAMUSCULAR | Status: AC
  Administered 2017-06-09: 25 mg via INTRAVENOUS
  Filled 2017-06-09: qty 1

## 2017-06-09 MED ORDER — DIPHENHYDRAMINE HCL 50 MG/ML IJ SOLN
25.0000 mg | Freq: Once | INTRAMUSCULAR | Status: AC
  Administered 2017-06-09: 25 mg via INTRAVENOUS

## 2017-06-09 NOTE — ED Triage Notes (Signed)
Pt reports mid chest pain started yesterday,with no radiation.  [facility]. Also reports headache, shortness of breath and blurry vision. Hx HTN.

## 2017-06-09 NOTE — ED Notes (Signed)
Pt became anxious, c/o SOB, VS WNL possibly having reaction to reglan. EDP made aware. Benadryl ordered.

## 2017-06-09 NOTE — ED Notes (Signed)
Bed: WA09 Expected date:  Expected time:  Means of arrival:  Comments: 

## 2017-06-09 NOTE — ED Notes (Signed)
Patient transported to X-ray 

## 2017-06-09 NOTE — ED Notes (Signed)
Pt back from CT, resting comfortably.

## 2017-06-09 NOTE — ED Provider Notes (Signed)
[facility] Provider Note   CSN: [id number] Arrival date & time: 06/09/17  0730     History   Chief Complaint Chief Complaint  Patient presents with  . Chest Pain    HPI [name] is a 46 y.o. male.  HPI  46 year old male with a history of hypertension, hypercholesterolemia, and a prediabetic presents with a chief complaint of a headache. He states that he also has been having chest pain since yesterday, this resolved last night. The chest pain started sometime around 2 PM while at work. He's had a lot of stress over the last couple days with work. The chest pain was like a pulsating pain in his chest. It was in his mid and left anterior chest. No radiation otherwise. Some shortness of breath. This seemed to resolve on its own. He went to urgent care last night and they recommended he go to the ER but he declined. Now no further chest pain. The headache started a couple hours after the chest pain and is primarily left-sided but also goes to both sides. It waxes and wanes and changes quality. Sometimes it is sharp, sometimes throbbing, sometimes numb. He has noticed some left fingertip and some hand/wrist numbness and tingling since last night. He thinks it might be weak but is not sure. He's also had dizziness which was a primary reason for why he came in this morning. Feels like he is off balance. He had blurry vision last night with the headache, though he thinks this is resolved. Yesterday he woke up with left-sided neck pain which he describes as moderate and constant. He does not have any injuries to his neck. No trouble moving his neck. No fevers during this time. HA is a 6/10. Took 4 ibuprofen this AM without relief.  Past Medical History:  Diagnosis Date  . Anginal pain (HCC)    04/2011 pt had agina, seen by cardiologist, no further treatment  . High cholesterol   . Hypertension     There are no active problems to display for this patient.   Past Surgical  History:  Procedure Laterality Date  . TONSILLECTOMY         Home Medications    Prior to Admission medications   Medication Sig Start Date End Date Taking? Authorizing Provider  amLODipine (NORVASC) 5 MG tablet Take 5 mg by mouth daily. 05/31/17  Yes [provider]  aspirin EC 81 MG tablet Take 81 mg by mouth every morning.   Yes [provider]  atorvastatin (LIPITOR) 40 MG tablet  02/14/17  Yes [provider]  hydrochlorothiazide (HYDRODIURIL) 25 MG tablet  02/14/17  Yes [provider]  latanoprost (XALATAN) 0.005 % ophthalmic solution Place 1 drop into both eyes 2 (two) times daily. 06/03/17  Yes [provider]  LUMIGAN 0.01 % SOLN Place 1 drop into both eyes 2 (two) times daily. 05/31/17  Yes [provider]  timolol (TIMOPTIC) 0.5 % ophthalmic solution Place 1 drop into both eyes 2 (two) times daily. 06/02/17  Yes [provider]    Family History Family History  Problem Relation Age of Onset  . Diabetes Mother   . Hypertension Mother   . Hypertension Father   . Diabetes Father     Social History Social History  Substance Use Topics  . Smoking status: Light Tobacco Smoker    Types: Cigars  . Smokeless tobacco: Never Used  . Alcohol use No     Allergies   Reglan [  metoclopramide]   Review of Systems Review of Systems  Eyes: Positive for visual disturbance.  Respiratory: Positive for shortness of breath.   Cardiovascular: Positive for chest pain.  Gastrointestinal: Positive for nausea. Negative for vomiting.  Musculoskeletal: Positive for neck pain.  Neurological: Positive for dizziness, weakness, numbness and headaches.  All other systems reviewed and are negative.    Physical Exam Updated Vital Signs BP 134/68   Pulse 75   Temp 98 F (36.7 C)   Resp 17   SpO2 98%   Physical Exam  Constitutional: He is oriented to person, place, and time. He appears well-developed and well-nourished. No  distress.  Morbidly obese  HENT:  Head: Normocephalic and atraumatic.  Right Ear: External ear normal.  Left Ear: External ear normal.  Nose: Nose normal.  Eyes: Pupils are equal, round, and reactive to light. EOM are normal. Right eye exhibits no discharge. Left eye exhibits no discharge.  Neck: Normal range of motion. Neck supple. Muscular tenderness (mild, left) present. No spinous process tenderness present. No neck rigidity. Normal range of motion present.    Cardiovascular: Normal rate, regular rhythm and normal heart sounds.   Pulmonary/Chest: Effort normal and breath sounds normal. He exhibits no tenderness.  Abdominal: Soft. There is no tenderness.  Musculoskeletal: He exhibits no edema.  Neurological: He is alert and oriented to person, place, and time.  CN 3-12 grossly intact. 5/5 strength in all 4 extremities. Grossly normal sensation, specifically normal and equal in hands. Normal finger to nose.   Skin: Skin is warm and dry. He is not diaphoretic.  Nursing note and vitals reviewed.    ED Treatments / Results  Labs (all labs ordered are listed, but only abnormal results are displayed) Labs Reviewed  BASIC METABOLIC PANEL - Abnormal; Notable for the following:       Result Value   Glucose, Bld 122 (*)    All other components within normal limits  CBC  I-STAT TROPONIN, ED  I-STAT TROPONIN, ED    EKG  EKG Interpretation  Date/Time:  Friday June 09 2017 07:39:57 EDT Ventricular Rate:  76 PR Interval:    QRS Duration: 110 QT Interval:  399 QTC Calculation: 449 R Axis:   6 Text Interpretation:  Sinus rhythm Borderline T wave abnormalities T wave flattening a little more prominent compared to Aug 2017 Confirmed by Pricilla LovelessGoldston, Skyeler Smola [phone]) on 06/09/2017 7:54:18 AM Also confirmed by Pricilla LovelessGoldston, Armen Waring [phone]), editor Misty StanleyScales-Price, Shannon [phone])  on 06/09/2017 8:58:42 AM       EKG Interpretation  Date/Time:  Friday June 09 2017 10:33:56 EDT Ventricular Rate:   79 PR Interval:    QRS Duration: 107 QT Interval:  390 QTC Calculation: 448 R Axis:   5 Text Interpretation:  Sinus rhythm Borderline T wave abnormalities no significant change compared to earlier in the day Confirmed by Pricilla LovelessGoldston, Kailynne Ferrington [phone]) on 06/09/2017 10:43:00 AM        Radiology Ct Angio Head W Or Wo Contrast  Result Date: 06/09/2017 CLINICAL DATA:  46 year old male with headache and left arm numbness and tingling since yesterday. Left side neck pain. EXAM: CT ANGIOGRAPHY HEAD AND NECK TECHNIQUE: Multidetector CT imaging of the head and neck was performed using the standard protocol during bolus administration of intravenous contrast. Multiplanar CT image reconstructions and MIPs were obtained to evaluate the vascular anatomy. Carotid stenosis measurements (when applicable) are obtained utilizing NASCET criteria, using the distal internal carotid diameter as the denominator. CONTRAST:  100 mL Isovue 370 COMPARISON:  Head CT without contrast 06/04/2016. CT head and cervical spine 01/04/2012. MRA head and neck 10/14/1999 6/12 02/17/2005. FINDINGS: CT HEAD Brain: Cerebral volume is stable and normal. No midline shift, ventriculomegaly, mass effect, evidence of mass lesion, intracranial hemorrhage or evidence of cortically based acute infarction. Gray-white matter differentiation is within normal limits throughout the brain. Calvarium and skull base: Negative. Paranasal sinuses: Visualized paranasal sinuses and mastoids are stable and well pneumatized; small chronic maxillary alveolar recess mucous retention cysts. Orbits: Visualized orbits and scalp soft tissues are within normal limits. CTA NECK Skeleton: Chronic straightening of cervical lordosis and bridging anterior endplate osteophytosis at C2-C3. No acute osseous abnormality identified. Upper chest: Negative lung apices. Superior mediastinal lipomatosis, but no lymphadenopathy. Other neck: Bilateral neck soft tissues are within normal  limits. No cervical lymphadenopathy. Aortic arch: Streak and quantum mottle artifact through the upper chest related to large body habitus. Bovine type arch configuration. No arch atherosclerosis or great vessel stenosis is evident. Right carotid system: Negative. Left carotid system: Negative. Vertebral arteries:No proximal subclavian artery stenosis. The right vertebral artery has an early takeoff, and its origin appears normal. The right vertebral artery is mildly dominant, and appears patent and normal to the skullbase. The proximal left vertebral artery is smaller and detail of its origin is obscured (series 15, image 161). The left vertebral artery otherwise appears patent and normal to the skullbase. CTA HEAD Posterior circulation: Codominant distal vertebral arteries. Normal right PICA origin, the left AICA appears dominant. No distal vertebral or vertebrobasilar junction stenosis. No basilar stenosis. AICA, SCA, and PCA origins are normal. Posterior communicating arteries are present. Bilateral PCA branches are within normal limits. Anterior circulation: Both ICA siphons are patent without stenosis. There is mild if any siphon calcified plaque. Ophthalmic and posterior communicating artery origins are normal. Patent carotid termini. Normal ACA and MCA origins. Dominant right A1 segment. Anterior communicating artery and bilateral ACA branches are within normal limits, the right ACA remains dominant throughout, somewhat simulating azygos type ACA anatomy. Left MCA origin, M1 segment, bifurcation, and left MCA branches are within normal limits. Right MCA M1 segment bifurcates early. Right MCA branches are within normal limits. Venous sinuses: Patent. Anatomic variants: Bovine type arch configuration. Mildly dominant proximal right vertebral artery. Dominant right ACA. Delayed phase: No abnormal enhancement identified. Review of the MIP images confirms the above findings IMPRESSION: 1. Negative CTA head and  neck.  No significant atherosclerosis. 2. Stable and normal CT appearance of the brain. 3. No acute findings identified in the neck or upper chest. Electronically Signed   By: Odessa Fleming M.D.   On: 06/09/2017 10:01   Dg Chest 2 View  Result Date: 06/09/2017 CLINICAL DATA:  Chest pain and shortness of breath EXAM: CHEST  2 VIEW COMPARISON:  June 04, 2016 FINDINGS: Lungs are clear. Heart size and pulmonary vascularity are normal. No adenopathy. No pneumothorax. No evident bone lesion. IMPRESSION: No edema or consolidation. Electronically Signed   By: Bretta Bang III M.D.   On: 06/09/2017 08:18   Ct Angio Neck W And/or Wo Contrast  Result Date: 06/09/2017 CLINICAL DATA:  46 year old male with headache and left arm numbness and tingling since yesterday. Left side neck pain. EXAM: CT ANGIOGRAPHY HEAD AND NECK TECHNIQUE: Multidetector CT imaging of the head and neck was performed using the standard protocol during bolus administration of intravenous contrast. Multiplanar CT image reconstructions and MIPs were obtained to evaluate the vascular anatomy. Carotid stenosis measurements (when applicable) are obtained utilizing NASCET  criteria, using the distal internal carotid diameter as the denominator. CONTRAST:  100 mL Isovue 370 COMPARISON:  Head CT without contrast 06/04/2016. CT head and cervical spine 01/04/2012. MRA head and neck 10/14/1999 6/12 02/17/2005. FINDINGS: CT HEAD Brain: Cerebral volume is stable and normal. No midline shift, ventriculomegaly, mass effect, evidence of mass lesion, intracranial hemorrhage or evidence of cortically based acute infarction. Gray-white matter differentiation is within normal limits throughout the brain. Calvarium and skull base: Negative. Paranasal sinuses: Visualized paranasal sinuses and mastoids are stable and well pneumatized; small chronic maxillary alveolar recess mucous retention cysts. Orbits: Visualized orbits and scalp soft tissues are within normal limits.  CTA NECK Skeleton: Chronic straightening of cervical lordosis and bridging anterior endplate osteophytosis at C2-C3. No acute osseous abnormality identified. Upper chest: Negative lung apices. Superior mediastinal lipomatosis, but no lymphadenopathy. Other neck: Bilateral neck soft tissues are within normal limits. No cervical lymphadenopathy. Aortic arch: Streak and quantum mottle artifact through the upper chest related to large body habitus. Bovine type arch configuration. No arch atherosclerosis or great vessel stenosis is evident. Right carotid system: Negative. Left carotid system: Negative. Vertebral arteries:No proximal subclavian artery stenosis. The right vertebral artery has an early takeoff, and its origin appears normal. The right vertebral artery is mildly dominant, and appears patent and normal to the skullbase. The proximal left vertebral artery is smaller and detail of its origin is obscured (series 15, image 161). The left vertebral artery otherwise appears patent and normal to the skullbase. CTA HEAD Posterior circulation: Codominant distal vertebral arteries. Normal right PICA origin, the left AICA appears dominant. No distal vertebral or vertebrobasilar junction stenosis. No basilar stenosis. AICA, SCA, and PCA origins are normal. Posterior communicating arteries are present. Bilateral PCA branches are within normal limits. Anterior circulation: Both ICA siphons are patent without stenosis. There is mild if any siphon calcified plaque. Ophthalmic and posterior communicating artery origins are normal. Patent carotid termini. Normal ACA and MCA origins. Dominant right A1 segment. Anterior communicating artery and bilateral ACA branches are within normal limits, the right ACA remains dominant throughout, somewhat simulating azygos type ACA anatomy. Left MCA origin, M1 segment, bifurcation, and left MCA branches are within normal limits. Right MCA M1 segment bifurcates early. Right MCA branches are  within normal limits. Venous sinuses: Patent. Anatomic variants: Bovine type arch configuration. Mildly dominant proximal right vertebral artery. Dominant right ACA. Delayed phase: No abnormal enhancement identified. Review of the MIP images confirms the above findings IMPRESSION: 1. Negative CTA head and neck.  No significant atherosclerosis. 2. Stable and normal CT appearance of the brain. 3. No acute findings identified in the neck or upper chest. Electronically Signed   By: Odessa Fleming M.D.   On: 06/09/2017 10:01    Procedures Procedures (including critical care time)  Medications Ordered in ED Medications  iopamidol (ISOVUE-370) 76 % injection (not administered)  metoCLOPramide (REGLAN) injection 10 mg (10 mg Intravenous Given 06/09/17 0837)  dexamethasone (DECADRON) injection 10 mg (10 mg Intravenous Given 06/09/17 0837)  diphenhydrAMINE (BENADRYL) injection 25 mg (25 mg Intravenous Given 06/09/17 0837)  sodium chloride 0.9 % bolus 1,000 mL (0 mLs Intravenous Stopped 06/09/17 1139)  iopamidol (ISOVUE-370) 76 % injection 100 mL (100 mLs Intravenous Contrast Given 06/09/17 0910)  diphenhydrAMINE (BENADRYL) injection 25 mg (25 mg Intravenous Given 06/09/17 0859)     Initial Impression / Assessment and Plan / ED Course  I have reviewed the triage vital signs and the nursing notes.  Pertinent labs & imaging results  that were available during my care of the patient were reviewed by me and considered in my medical decision making (see chart for details).     Patient's chest pain is atypical and has been resolved since last night. ECG with nonspecific T-wave flattening but no dynamic changes throughout his stay in the ED. 2 negative troponins. Suspicion for ACS, PE, dissection are low, especially since he is currently asymptomatic. His headache is atypical and seems to be waxing and waning. I think subarachnoid hemorrhage is probably unlikely. No current neck stiffness or fever to suggest infectious  source. Given his various different complaints such as transient blurry vision, on and off dizziness, and vague left hand numbness, CT angio of the head and neck were obtained given his unclear neck pain on the left side. All this is unremarkable. He had a little bit of restlessness after Reglan but it did help his headache and the restlessness was controlled with Benadryl. Headache is essentially gone. His vague hand symptoms are also gone. Doubt this is related to a stroke, especially since his symptoms were ipsilateral. Given it resolved with the headache, I do not think MRI is necessary as stroke is less likely. Almost is like a complicated migraine. At this point, I think he is stable for discharge home and I have recommended he follow-up closely with his PCP. Discussed return precautions.  Final Clinical Impressions(s) / ED Diagnoses   Final diagnoses:  Atypical chest pain  Acute nonintractable headache, unspecified headache type    New Prescriptions Discharge Medication List as of 06/09/2017 11:35 AM       Pricilla Loveless, MD 06/09/17 [phone]

## 2017-06-09 NOTE — ED Notes (Signed)
Pt reports symptoms started yesterday after stressful interaction with boss at work.

## 2017-06-26 ENCOUNTER — Ambulatory Visit: Payer: Self-pay | Admitting: Neurology

## 2017-06-26 ENCOUNTER — Telehealth: Payer: Self-pay

## 2017-06-26 NOTE — Telephone Encounter (Signed)
Pt did not show for their appt with Dr. Athar today.  

## 2017-06-28 ENCOUNTER — Encounter: Payer: Self-pay | Admitting: Neurology

## 2017-09-23 ENCOUNTER — Encounter (HOSPITAL_COMMUNITY): Payer: Self-pay | Admitting: Emergency Medicine

## 2017-09-23 ENCOUNTER — Other Ambulatory Visit: Payer: Self-pay

## 2017-09-23 ENCOUNTER — Ambulatory Visit (HOSPITAL_COMMUNITY)
Admission: EM | Admit: 2017-09-23 | Discharge: 2017-09-23 | Disposition: A | Discharge status: Home / Self Care | Payer: Medicaid Other | Attending: Internal Medicine | Admitting: Internal Medicine

## 2017-09-23 DIAGNOSIS — K648 Other hemorrhoids: Secondary | ICD-10-CM

## 2017-09-23 DIAGNOSIS — R1031 Right lower quadrant pain: Secondary | ICD-10-CM | POA: Diagnosis not present

## 2017-09-23 DIAGNOSIS — K644 Residual hemorrhoidal skin tags: Secondary | ICD-10-CM

## 2017-09-23 MED ORDER — KETOROLAC TROMETHAMINE 30 MG/ML IJ SOLN: 60.0000 mg | Freq: Once | INTRAMUSCULAR | Status: DC

## 2017-09-23 MED ORDER — LIDOCAINE (ANORECTAL) 5 % EX CREA: 1.0000 | TOPICAL_CREAM | CUTANEOUS | 0 refills | Status: DC | PRN

## 2017-09-23 MED ORDER — KETOROLAC TROMETHAMINE 60 MG/2ML IM SOLN
INTRAMUSCULAR | Status: AC
  Filled 2017-09-23: qty 2

## 2017-09-23 MED ORDER — KETOROLAC TROMETHAMINE 60 MG/2ML IM SOLN
60.0000 mg | Freq: Once | INTRAMUSCULAR | Status: AC
  Administered 2017-09-23: 60 mg via INTRAMUSCULAR

## 2017-09-23 MED ORDER — NAPROXEN 500 MG PO TABS: 500.0000 mg | ORAL_TABLET | Freq: Two times a day (BID) | ORAL | 0 refills | Status: AC

## 2017-09-23 NOTE — ED Notes (Signed)
Pt states he was lifting soemnthing heavy and felt something pop. Pt c/o severe pain in abdomen, testicles, back, rectum. Pt will not sit still, states "this is the worst pain of my life". Pt unable to sit down due to pain. Dr. Dayton ScrapeMurray assesses patient, pt stating he wants to stay and be seen here.

## 2017-09-23 NOTE — Discharge Instructions (Signed)
Lidocaine for external hemorrhoids. Naproxen for muscle strain. Please follow up with your PCP for further evaluation of possible hernia. If experiencing worsening symptoms, increased swelling of the testicle, follow up at the emergency department for further evaluation.

## 2017-09-23 NOTE — ED Triage Notes (Signed)
Dr. Dayton ScrapeMurray assessing patient.

## 2017-09-23 NOTE — ED Provider Notes (Signed)
[facility]    CSN: [id number] Arrival date & time: 09/23/17  1751     History   Chief Complaint Chief Complaint  Patient presents with  . Abdominal Pain    HPI Marlan PalauRonald M Olheiser is a 46 y.o. male.   46 year old male with history of HLD, HTN comes in for acute onset of rectal and groin pain while lifting a heavy dresser. At triage, patient unwilling to sit down with 10/10 pain, Toradol was given which "took the edge off" and stating pain is now 8/10. He noticed after lifting the dresser that right testicle is larger than the left. He also stated he felt a lump near his rectum shortly after lifting dresser. He mentioned noticing a lump in the past but unable to provide date ranges. He denies numbness/tingling, saddle anesthesia, loss of bladder or bowel control. Denies back pain. Denies history of hernia.       Past Medical History:  Diagnosis Date  . Anginal pain (HCC)    04/2011 pt had agina, seen by cardiologist, no further treatment  . High cholesterol   . Hypertension     There are no active problems to display for this patient.   Past Surgical History:  Procedure Laterality Date  . TONSILLECTOMY         Home Medications    Prior to Admission medications   Medication Sig Start Date End Date Taking? Authorizing Provider  amLODipine (NORVASC) 5 MG tablet Take 5 mg by mouth daily. 05/31/17   [provider]  aspirin EC 81 MG tablet Take 81 mg by mouth every morning.    [provider]  atorvastatin (LIPITOR) 40 MG tablet  02/14/17   [provider]  hydrochlorothiazide (HYDRODIURIL) 25 MG tablet  02/14/17   [provider]  latanoprost (XALATAN) 0.005 % ophthalmic solution Place 1 drop into both eyes 2 (two) times daily. 06/03/17   [provider]  Lidocaine, Anorectal, 5 % CREA Apply 1 Dose topically every 4 (four) hours as needed. 09/23/17   Juliocesar Blasius V, PA-C  LUMIGAN 0.01 % SOLN Place 1 drop into both eyes 2  (two) times daily. 05/31/17   [provider]  naproxen (NAPROSYN) 500 MG tablet Take 1 tablet (500 mg total) by mouth 2 (two) times daily for 10 days. 09/23/17 10/03/17  Cathie HoopsYu, Derald Lorge V, PA-C  timolol (TIMOPTIC) 0.5 % ophthalmic solution Place 1 drop into both eyes 2 (two) times daily. 06/02/17   [provider]    Family History Family History  Problem Relation Age of Onset  . Diabetes Mother   . Hypertension Mother   . Hypertension Father   . Diabetes Father     Social History Social History   Tobacco Use  . Smoking status: Light Tobacco Smoker    Types: Cigars  . Smokeless tobacco: Never Used  Substance Use Topics  . Alcohol use: No  . Drug use: No     Allergies   Reglan [metoclopramide]   Review of Systems Review of Systems  Reason unable to perform ROS: See HPI as above.     Physical Exam Triage Vital Signs ED Triage Vitals  Enc Vitals Group     BP 09/23/17 1822 (!) 173/89     Pulse Rate 09/23/17 1822 87     Resp 09/23/17 1822 (!) 26     Temp 09/23/17 1822 98.1 F (36.7 C)     Temp Source 09/23/17 1822 Oral  SpO2 09/23/17 1822 100 %     Weight --      Height --      Head Circumference --      Peak Flow --      Pain Score 09/23/17 1827 10     Pain Loc --      Pain Edu? --      Excl. in GC? --    No data found.  Updated Vital Signs BP (!) 173/89   Pulse 87   Temp 98.1 F (36.7 C) (Oral)   Resp (!) 26 Comment: pt hyperventilating  SpO2 100%   Physical Exam  Constitutional: He is oriented to person, place, and time. He appears well-developed and well-nourished. No distress.  HENT:  Head: Normocephalic and atraumatic.  Eyes: Conjunctivae are normal. Pupils are equal, round, and reactive to light.  Cardiovascular: Normal rate, regular rhythm and normal heart sounds. Exam reveals no gallop and no friction rub.  No murmur heard. Pulmonary/Chest: Effort normal and breath sounds normal. No stridor. He has no wheezes.  Abdominal:  Exam  limited due to body habitus. Soft with normal bowel sounds. No obvious tenderness on palpation. No guarding, rebound.  Genitourinary:  Genitourinary Comments: Fullness and mild tenderness of inguinal canal, without frank incarceration.  External hemorrhoids without thrombosis.   Neurological: He is alert and oriented to person, place, and time.     UC Treatments / Results  Labs (all labs ordered are listed, but only abnormal results are displayed) Labs Reviewed - No data to display  EKG  EKG Interpretation None       Radiology No results found.  Procedures Procedures (including critical care time)  Medications Ordered in UC Medications  ketorolac (TORADOL) injection 60 mg (60 mg Intramuscular Given 09/23/17 1833)     Initial Impression / Assessment and Plan / UC Course  I have reviewed the triage vital signs and the nursing notes.  Pertinent labs & imaging results that were available during my care of the patient were reviewed by me and considered in my medical decision making (see chart for details).    Toradol in office today with good relief of pain. Discussed possible inguinal hernia without frank incarceration. Naproxen for pain. Lidocaine for external hemorrhoid. Will have patient follow up with PCP for further evaluation of hernia and hemorrhoids. Strict return precautions given.   Case discussed with Dr Dayton ScrapeMurray, who also examined patient, and agrees to plan.   Final Clinical Impressions(s) / UC Diagnoses   Final diagnoses:  Right inguinal pain  External hemorrhoids    ED Discharge Orders        Ordered    Lidocaine, Anorectal, 5 % CREA  Every 4 hours PRN     09/23/17 1947    naproxen (NAPROSYN) 500 MG tablet  2 times daily     09/23/17 1947        Lurline IdolYu, Brexton Sofia V, PA-C 09/23/17 2128

## 2017-09-26 ENCOUNTER — Encounter: Payer: Self-pay | Admitting: Physician Assistant

## 2017-09-26 ENCOUNTER — Emergency Department (HOSPITAL_COMMUNITY)
Admission: EM | Admit: 2017-09-26 | Discharge: 2017-09-26 | Disposition: A | Discharge status: Home / Self Care | Payer: Medicaid Other | Attending: Emergency Medicine | Admitting: Emergency Medicine

## 2017-09-26 ENCOUNTER — Emergency Department (HOSPITAL_COMMUNITY): Payer: Medicaid Other

## 2017-09-26 ENCOUNTER — Encounter (HOSPITAL_COMMUNITY): Payer: Self-pay

## 2017-09-26 ENCOUNTER — Other Ambulatory Visit: Payer: Self-pay

## 2017-09-26 DIAGNOSIS — F17290 Nicotine dependence, other tobacco product, uncomplicated: Secondary | ICD-10-CM | POA: Diagnosis not present

## 2017-09-26 DIAGNOSIS — I10 Essential (primary) hypertension: Secondary | ICD-10-CM | POA: Diagnosis not present

## 2017-09-26 DIAGNOSIS — Z7982 Long term (current) use of aspirin: Secondary | ICD-10-CM | POA: Diagnosis not present

## 2017-09-26 DIAGNOSIS — K644 Residual hemorrhoidal skin tags: Secondary | ICD-10-CM | POA: Diagnosis not present

## 2017-09-26 DIAGNOSIS — Z79899 Other long term (current) drug therapy: Secondary | ICD-10-CM | POA: Insufficient documentation

## 2017-09-26 DIAGNOSIS — K6289 Other specified diseases of anus and rectum: Secondary | ICD-10-CM | POA: Diagnosis present

## 2017-09-26 LAB — CBC
HCT: 40.6 % (ref 39.0–52.0)
Hemoglobin: 13.3 g/dL (ref 13.0–17.0)
MCH: 27.2 pg (ref 26.0–34.0)
MCHC: 32.8 g/dL (ref 30.0–36.0)
MCV: 83 fL (ref 78.0–100.0)
Platelets: 321 10*3/uL (ref 150–400)
RBC: 4.89 MIL/uL (ref 4.22–5.81)
RDW: 15.2 % (ref 11.5–15.5)
WBC: 8.4 10*3/uL (ref 4.0–10.5)

## 2017-09-26 LAB — BASIC METABOLIC PANEL
Anion gap: 6 (ref 5–15)
BUN: 13 mg/dL (ref 6–20)
CO2: 27 mmol/L (ref 22–32)
Calcium: 8.9 mg/dL (ref 8.9–10.3)
Chloride: 105 mmol/L (ref 101–111)
Creatinine, Ser: 0.97 mg/dL (ref 0.61–1.24)
GFR calc Af Amer: >60 mL/min (ref >60)
GFR calc non Af Amer: >60 mL/min (ref >60)
Glucose, Bld: 113 mg/dL — ABNORMAL HIGH (ref 65–99)
Potassium: 3.7 mmol/L (ref 3.5–5.1)
Sodium: 138 mmol/L (ref 135–145)

## 2017-09-26 LAB — I-STAT TROPONIN, ED: Troponin i, poc: 0 ng/mL (ref 0.00–0.08)

## 2017-09-26 MED ORDER — TRAMADOL HCL 50 MG PO TABS
50.0000 mg | ORAL_TABLET | Freq: Once | ORAL | Status: AC
  Administered 2017-09-26: 50 mg via ORAL
  Filled 2017-09-26: qty 1

## 2017-09-26 MED ORDER — IBUPROFEN 400 MG PO TABS
400.0000 mg | ORAL_TABLET | Freq: Once | ORAL | Status: AC
  Administered 2017-09-26: 400 mg via ORAL
  Filled 2017-09-26: qty 1

## 2017-09-26 MED ORDER — IOPAMIDOL (ISOVUE-300) INJECTION 61%
INTRAVENOUS | Status: AC
  Administered 2017-09-26: 14:00:00
  Filled 2017-09-26: qty 100

## 2017-09-26 MED ORDER — DOXYCYCLINE HYCLATE 100 MG PO TABS
100.0000 mg | ORAL_TABLET | Freq: Once | ORAL | Status: AC
  Administered 2017-09-26: 100 mg via ORAL
  Filled 2017-09-26: qty 1

## 2017-09-26 MED ORDER — DOXYCYCLINE HYCLATE 100 MG PO CAPS: 100.0000 mg | ORAL_CAPSULE | Freq: Two times a day (BID) | ORAL | 0 refills | Status: DC

## 2017-09-26 MED ORDER — TRAMADOL HCL 50 MG PO TABS: 50.0000 mg | ORAL_TABLET | Freq: Four times a day (QID) | ORAL | 0 refills | Status: DC | PRN

## 2017-09-26 MED ORDER — HYDROCORTISONE 2.5 % RE CREA: 1.0000 "application " | TOPICAL_CREAM | Freq: Two times a day (BID) | RECTAL | 0 refills | Status: DC

## 2017-09-26 NOTE — ED Provider Notes (Addendum)
[facility] Provider Note   CSN: [id number] Arrival date & time: 09/26/17  [id number]     History   Chief Complaint Chief Complaint  Patient presents with  . CP/arm pain/rectal pain    HPI [name] is a 46 y.o. male.  Patient c/o pain in rectal and perirectal area, esp towards right of anus for the past week. Symptoms constant, moderate, persistent. Denies hx of recurrent hemorrhoids, and denies hx perirectal abscess. Recent dx with hemorrhoid, but states meds not helping.  +recent constipation, had bm yesterday. Denies fever or chills. No trauma to area. No drainage from area.    The history is provided by the patient.    Past Medical History:  Diagnosis Date  . Anginal pain (HCC)    04/2011 pt had agina, seen by cardiologist, no further treatment  . High cholesterol   . Hypertension     There are no active problems to display for this patient.   Past Surgical History:  Procedure Laterality Date  . TONSILLECTOMY         Home Medications    Prior to Admission medications   Medication Sig Start Date End Date Taking? Authorizing Provider  amLODipine (NORVASC) 5 MG tablet Take 5 mg by mouth daily. 05/31/17   [provider]  aspirin EC 81 MG tablet Take 81 mg by mouth every morning.    [provider]  atorvastatin (LIPITOR) 40 MG tablet  02/14/17   [provider]  hydrochlorothiazide (HYDRODIURIL) 25 MG tablet  02/14/17   [provider]  latanoprost (XALATAN) 0.005 % ophthalmic solution Place 1 drop into both eyes 2 (two) times daily. 06/03/17   [provider]  Lidocaine, Anorectal, 5 % CREA Apply 1 Dose topically every 4 (four) hours as needed. 09/23/17   Yu, Amy V, PA-C  LUMIGAN 0.01 % SOLN Place 1 drop into both eyes 2 (two) times daily. 05/31/17   [provider]  naproxen (NAPROSYN) 500 MG tablet Take 1 tablet (500 mg total) by mouth 2 (two) times daily for 10 days.  09/23/17 10/03/17  Cathie HoopsYu, Amy V, PA-C  timolol (TIMOPTIC) 0.5 % ophthalmic solution Place 1 drop into both eyes 2 (two) times daily. 06/02/17   [provider]    Family History Family History  Problem Relation Age of Onset  . Diabetes Mother   . Hypertension Mother   . Hypertension Father   . Diabetes Father     Social History Social History   Tobacco Use  . Smoking status: Light Tobacco Smoker    Types: Cigars  . Smokeless tobacco: Never Used  Substance Use Topics  . Alcohol use: No  . Drug use: No     Allergies   Reglan [metoclopramide]   Review of Systems Review of Systems  Constitutional: Negative for fever.  HENT: Negative for sore throat.   Eyes: Negative for redness.  Respiratory: Negative for shortness of breath.   Cardiovascular: Negative for chest pain.  Gastrointestinal: Positive for constipation and rectal pain. Negative for abdominal pain and vomiting.  Genitourinary: Negative for flank pain.  Musculoskeletal: Negative for back pain and neck pain.  Skin: Negative for rash.  Neurological: Negative for headaches.  Hematological: Does not bruise/bleed easily.  Psychiatric/Behavioral: Negative for confusion.     Physical Exam Updated Vital Signs BP 130/60   Pulse 81   Temp 98.1 F (36.7 C) (Oral)   Resp 16   Ht 1.626 m (5\' 4" )  Wt (!) 145.2 kg (320 lb)   SpO2 98%   BMI 54.93 kg/m   Physical Exam  Constitutional: He appears well-developed and well-nourished. No distress.  HENT:  Mouth/Throat: Oropharynx is clear and moist.  Eyes: Conjunctivae are normal.  Neck: Neck supple. No tracheal deviation present.  Cardiovascular: Normal rate, regular rhythm, normal heart sounds and intact distal pulses. Exam reveals no gallop and no friction rub.  No murmur heard. Pulmonary/Chest: Effort normal and breath sounds normal. No accessory muscle usage. No respiratory distress.  Abdominal: Soft. Bowel sounds are normal. He exhibits no distension.  There is no tenderness.  obese  Genitourinary:  Genitourinary Comments: No cva tenderness.   Small external hemorrhoid, not acutely thrombosed, ?mildy inflamed, no bleeding. Mild indurated to right perirectal area adjacent to hemorrhoid, no definite abscess felt (pt v obese, and tolerates thorough exam of area very poorly).  Musculoskeletal: He exhibits no edema.  Neurological: He is alert.  Skin: Skin is warm and dry. He is not diaphoretic.  Psychiatric: He has a normal mood and affect.  Nursing note and vitals reviewed.    ED Treatments / Results  Labs (all labs ordered are listed, but only abnormal results are displayed) Results for orders placed or performed during the hospital encounter of 09/26/17  Basic metabolic panel  Result Value Ref Range   Sodium 138 135 - 145 mmol/L   Potassium 3.7 3.5 - 5.1 mmol/L   Chloride 105 101 - 111 mmol/L   CO2 27 22 - 32 mmol/L   Glucose, Bld 113 (H) 65 - 99 mg/dL   BUN 13 6 - 20 mg/dL   Creatinine, Ser 1.61 0.61 - 1.24 mg/dL   Calcium 8.9 8.9 - 09.6 mg/dL   GFR calc non Af Amer >60 >60 mL/min   GFR calc Af Amer >60 >60 mL/min   Anion gap 6 5 - 15  CBC  Result Value Ref Range   WBC 8.4 4.0 - 10.5 K/uL   RBC 4.89 4.22 - 5.81 MIL/uL   Hemoglobin 13.3 13.0 - 17.0 g/dL   HCT 04.5 40.9 - 81.1 %   MCV 83.0 78.0 - 100.0 fL   MCH 27.2 26.0 - 34.0 pg   MCHC 32.8 30.0 - 36.0 g/dL   RDW 91.4 78.2 - 95.6 %   Platelets 321 150 - 400 K/uL  I-stat troponin, ED  Result Value Ref Range   Troponin i, poc 0.00 0.00 - 0.08 ng/mL   Comment 3           Dg Chest 2 View  Result Date: 09/26/2017 CLINICAL DATA:  Chest pain, left arm pain, shortness of breath EXAM: CHEST  2 VIEW COMPARISON:  06/09/2017 FINDINGS: The heart size and mediastinal contours are within normal limits. Both lungs are clear. The visualized skeletal structures are unremarkable. IMPRESSION: No active cardiopulmonary disease. Electronically Signed   By: Elige Ko   On: 09/26/2017  08:17   Ct Pelvis W Contrast  Result Date: 09/26/2017 CLINICAL DATA:  Rectal pain.  Evaluate for perirectal abscess. EXAM: CT PELVIS WITH CONTRAST TECHNIQUE: Multidetector CT imaging of the pelvis was performed using the standard protocol following the bolus administration of intravenous contrast. CONTRAST:  < 100 cc > ISOVUE-300 IOPAMIDOL (ISOVUE-300) INJECTION 61% COMPARISON:  None. FINDINGS: Urinary Tract:  No abnormality visualized. Bowel: Unremarkable visualized pelvic bowel loops. Normal appendix. Vascular/Lymphatic: New no significant vascular abnormality seen. Borderline enlarged bilateral inguinal lymph nodes, nonspecific, but likely reactive. Reproductive:  The prostate is unremarkable. Other:  In the right perianal soft tissues, there is a 3.2 x 2.8 cm area of soft tissue induration without discrete drainable fluid collection. No free fluid in the pelvis. Musculoskeletal: No acute fracture or suspicious bone lesions identified. Degenerative changes of both hip joints. IMPRESSION: 1. In the right perianal soft tissues, there is a 3.2 x 2.8 cm area of soft tissue induration, possibly representing phlegmon or thrombosed hemorrhoid. No discrete rim enhancing or drainable fluid collection is seen to suggest abscess. Electronically Signed   By: Obie Dredge M.D.   On: 09/26/2017 14:21    EKG  EKG Interpretation  Date/Time:  Tuesday September 26 2017 07:23:20 EST Ventricular Rate:  61 PR Interval:  144 QRS Duration: 94 QT Interval:  388 QTC Calculation: 390 R Axis:   44 Text Interpretation:  Normal sinus rhythm with sinus arrhythmia Incomplete right bundle branch block Nonspecific T wave abnormality Confirmed by Cathren Laine ([id number]) on 09/26/2017 10:56:37 AM       Radiology Dg Chest 2 View  Result Date: 09/26/2017 CLINICAL DATA:  Chest pain, left arm pain, shortness of breath EXAM: CHEST  2 VIEW COMPARISON:  06/09/2017 FINDINGS: The heart size and mediastinal contours are within  normal limits. Both lungs are clear. The visualized skeletal structures are unremarkable. IMPRESSION: No active cardiopulmonary disease. Electronically Signed   By: Elige Ko   On: 09/26/2017 08:17   Ct Pelvis W Contrast  Result Date: 09/26/2017 CLINICAL DATA:  Rectal pain.  Evaluate for perirectal abscess. EXAM: CT PELVIS WITH CONTRAST TECHNIQUE: Multidetector CT imaging of the pelvis was performed using the standard protocol following the bolus administration of intravenous contrast. CONTRAST:  < 100 cc > ISOVUE-300 IOPAMIDOL (ISOVUE-300) INJECTION 61% COMPARISON:  None. FINDINGS: Urinary Tract:  No abnormality visualized. Bowel: Unremarkable visualized pelvic bowel loops. Normal appendix. Vascular/Lymphatic: New no significant vascular abnormality seen. Borderline enlarged bilateral inguinal lymph nodes, nonspecific, but likely reactive. Reproductive:  The prostate is unremarkable. Other: In the right perianal soft tissues, there is a 3.2 x 2.8 cm area of soft tissue induration without discrete drainable fluid collection. No free fluid in the pelvis. Musculoskeletal: No acute fracture or suspicious bone lesions identified. Degenerative changes of both hip joints. IMPRESSION: 1. In the right perianal soft tissues, there is a 3.2 x 2.8 cm area of soft tissue induration, possibly representing phlegmon or thrombosed hemorrhoid. No discrete rim enhancing or drainable fluid collection is seen to suggest abscess. Electronically Signed   By: Obie Dredge M.D.   On: 09/26/2017 14:21    Procedures Procedures (including critical care time)  Medications Ordered in ED Medications - No data to display   Initial Impression / Assessment and Plan / ED Course  I have reviewed the triage vital signs and the nursing notes.  Pertinent labs & imaging results that were available during my care of the patient were reviewed by me and considered in my medical decision making (see chart for details).  Patients  pain appears out of proportion to external hemorrhoid, and there is mild induration to right perirectal area immediately adjacent to rectum/hemorrhoid - will get imaging study to assess for deeper/larger abscess.   Labs.   CT neg for abscess but with mild inflammatory changes adjacent to hemorrhoid - ?due to inflammation vs early infection.    Will add abx rx to tx. Will refer to gen surgery f/u re hemorrhoid, possible early infxn.   No meds for pain since early AM.  Motrin po. Ultram po.  Pt afebrile,  not toxic or ill appearing. Pt currently appears stable for d/c.     Final Clinical Impressions(s) / ED Diagnoses   Final diagnoses:  None    ED Discharge Orders    None         Cathren LaineSteinl, Kenya Kook, MD 09/26/17 1455

## 2017-09-26 NOTE — ED Notes (Signed)
Denies chest and arm pain at this time.

## 2017-09-26 NOTE — Discharge Instructions (Signed)
It was our pleasure to provide your ER care today - we hope that you feel better.  Do sitz baths for symptom relief.  May use anusol cream and/or lidocaine cream as need for symptom relief.  Take motrin or aleve as need for pain.  You may also take ultram as need for pain - no driving for the next 6 hours, or when taking ultram.  Take antibiotic (doxycycline) as prescribed.  Take colace (stool softener) 2x/day as need - this medication is available over the counter.   Follow up with general surgeon in 1 week if symptoms fail to improve/resolve - call office to arrange appointment.   Return to ER if worse, new symptoms, fevers, increased swelling, spreading redness, other concern.

## 2017-09-26 NOTE — ED Triage Notes (Signed)
Patient here with multiple complaints. Intermittent chest pain and left arm pain since am. Also wants hemorrhoid rechecked and hernia rechecked [facility], CP has resolved at time of triage

## 2017-09-26 NOTE — ED Notes (Signed)
Dr. Steinl at bedside at this time.  

## 2017-09-26 NOTE — ED Notes (Signed)
Patient transported to CT 

## 2017-09-26 NOTE — ED Notes (Signed)
Pt back from X-ray.  

## 2017-09-27 ENCOUNTER — Telehealth: Payer: Self-pay

## 2017-09-27 NOTE — Telephone Encounter (Signed)
Received this notice from [employer]: This is a Dr. Frances FurbishAthar patient who is non-compliant. We are unable to obtain auth.  Pick up ticket has been issued.  Thank you

## 2017-10-09 ENCOUNTER — Ambulatory Visit: Payer: Medicaid Other | Admitting: Physician Assistant

## 2017-10-27 ENCOUNTER — Telehealth (INDEPENDENT_AMBULATORY_CARE_PROVIDER_SITE_OTHER): Payer: Self-pay | Admitting: Family

## 2017-10-27 NOTE — Telephone Encounter (Signed)
FYI patient is requesting a knee injection at appt 01/02. He did not remember what kind.

## 2017-10-27 NOTE — Telephone Encounter (Signed)
Review.

## 2017-10-27 NOTE — Telephone Encounter (Signed)
Last injection was cortisone [name] 2018, noted in appt notes

## 2017-11-01 ENCOUNTER — Encounter (INDEPENDENT_AMBULATORY_CARE_PROVIDER_SITE_OTHER): Payer: Self-pay | Admitting: Family

## 2017-11-01 ENCOUNTER — Ambulatory Visit (INDEPENDENT_AMBULATORY_CARE_PROVIDER_SITE_OTHER): Payer: Medicaid Other | Admitting: Family

## 2017-11-01 DIAGNOSIS — M1711 Unilateral primary osteoarthritis, right knee: Secondary | ICD-10-CM | POA: Diagnosis not present

## 2017-11-01 DIAGNOSIS — M1712 Unilateral primary osteoarthritis, left knee: Secondary | ICD-10-CM | POA: Diagnosis not present

## 2017-11-01 DIAGNOSIS — M170 Bilateral primary osteoarthritis of knee: Secondary | ICD-10-CM

## 2017-11-01 DIAGNOSIS — G5602 Carpal tunnel syndrome, left upper limb: Secondary | ICD-10-CM | POA: Diagnosis not present

## 2017-11-01 MED ORDER — METHYLPREDNISOLONE ACETATE 40 MG/ML IJ SUSP
40.0000 mg | INTRAMUSCULAR | Status: AC | PRN
  Administered 2017-11-01: 40 mg via INTRA_ARTICULAR

## 2017-11-01 MED ORDER — LIDOCAINE HCL 1 % IJ SOLN
5.0000 mL | INTRAMUSCULAR | Status: AC | PRN
  Administered 2017-11-01: 5 mL

## 2017-11-01 NOTE — Progress Notes (Signed)
Office Visit Note   Patient: [name]           Date of Birth: 06/23/1971           MRN: [id number] Visit Date: 11/01/2017              Requested by: Leilani Ableeese, Betti, MD [address] [place], [place] [id number] PCP: Leilani Ableeese, Betti, MD  Chief Complaint  Patient presents with  . Right Knee - Pain      HPI: The patient is a 47 year old gentleman who presents today complaining of recurrence of his chronic right knee pain. States he has medial side swelling as well. Complaining of pain with start up, pain with ambulation. Fears his knee will give out. Has not had any mechanical symptoms.  Also complaining of left knee and left wrist pain.  Left knee feels similar to right knee. Aching pain, worse with weight bearing. Some locking and feeling may give way. No relief with Ibu  Has history of carpal tunnel left wrist. Dr. August Saucerean released 3 years ago. States never felt it got much better. 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 Worse symptoms since. No relief with Ibu. Has tried splinting in past, does not have this currently. States tried Voltaren po in past without relief. Cannot remember if tried injections.  Assessment & Plan: Visit Diagnoses:  No diagnosis found.  Plan:  Depomedrol injection today. Offered Rx for NSAIDs for wrist, patient declined. Given wrist splint to wear at night and with typing on left. Will follow up in office as needed.   Follow-Up Instructions: No Follow-up on file.   Right Hand Exam   Tenderness  The patient is experiencing tenderness in the radial area and palmer area.  Muscle Strength  Grip: 5/5   Tests  Phalen's sign: positive Tinel's sign (median nerve): positive  Other  Erythema: absent Scars: present Pulse: present  Comments:  Mild swelling      Patient is alert, oriented, no adenopathy, well-dressed, normal affect, normal respiratory effort. Does have an antalgic gait. Has full range of motion of his knee.  There is medial and lateral joint line tenderness bilateral knees. Knees stable to varus and valgus stress there is no effusion. No erythema no redness  Imaging: No results found.  Labs: Lab Results  Component Value Date   HGBA1C (H) 11/11/2010    6.0 (NOTE)                                                                       According to the ADA Clinical Practice Recommendations for 2011, when HbA1c is used as a screening test:   >=6.5%   Diagnostic of Diabetes Mellitus           (if abnormal result  is confirmed)  5.7-6.4%   Increased risk of developing Diabetes Mellitus  References:Diagnosis and Classification of Diabetes Mellitus,Diabetes Care,2011,34(Suppl 1):S62-S69 and Standards of Medical Care in         Diabetes - 2011,Diabetes Care,2011,34  (Suppl 1):S11-S61.    Orders:  No orders of the defined types were placed in this encounter.  No orders of the defined types were placed in this encounter.    Procedures: Large Joint Inj: R  knee on 11/01/2017 2:57 PM Indications: pain Details: 18 G 1.5 in needle, anteromedial approach Medications: 5 mL lidocaine 1 %; 40 mg methylPREDNISolone acetate 40 MG/ML Consent was given by the patient.   Large Joint Inj: L knee on 11/01/2017 3:26 PM Indications: pain Details: 18 G 1.5 in needle, anteromedial approach Medications: 5 mL lidocaine 1 %; 40 mg methylPREDNISolone acetate 40 MG/ML Consent was given by the patient.      Clinical Data: No additional findings.  ROS:  All other systems negative, except as noted in the HPI. Review of Systems  Constitutional: Negative for chills and fever.  Musculoskeletal: Positive for arthralgias, gait problem and joint swelling.  Neurological: Negative for weakness.    Objective: Vital Signs: There were no vitals taken for this visit.  Specialty Comments:  No specialty comments available.  PMFS History: There are no active problems to display for this patient.  Past Medical History:    Diagnosis Date  . Anginal pain (HCC)    04/2011 pt had agina, seen by cardiologist, no further treatment  . High cholesterol   . Hypertension     Family History  Problem Relation Age of Onset  . Diabetes Mother   . Hypertension Mother   . Hypertension Father   . Diabetes Father     Past Surgical History:  Procedure Laterality Date  . TONSILLECTOMY     Social History   Occupational History  . Occupation: N/A  Tobacco Use  . Smoking status: Light Tobacco Smoker    Types: Cigars  . Smokeless tobacco: Never Used  Substance and Sexual Activity  . Alcohol use: No  . Drug use: No  . Sexual activity: Not on file

## 2017-11-08 ENCOUNTER — Other Ambulatory Visit (INDEPENDENT_AMBULATORY_CARE_PROVIDER_SITE_OTHER): Payer: Medicaid Other

## 2017-11-08 ENCOUNTER — Encounter: Payer: Self-pay | Admitting: Physician Assistant

## 2017-11-08 ENCOUNTER — Ambulatory Visit: Payer: Self-pay | Admitting: Surgery

## 2017-11-08 ENCOUNTER — Ambulatory Visit: Payer: Medicaid Other | Admitting: Physician Assistant

## 2017-11-08 VITALS — BP 136/78 | HR 70 | Ht 65.5 in | Wt 339.0 lb

## 2017-11-08 DIAGNOSIS — R9389 Abnormal findings on diagnostic imaging of other specified body structures: Secondary | ICD-10-CM | POA: Diagnosis not present

## 2017-11-08 DIAGNOSIS — K625 Hemorrhage of anus and rectum: Secondary | ICD-10-CM | POA: Diagnosis not present

## 2017-11-08 DIAGNOSIS — K6289 Other specified diseases of anus and rectum: Secondary | ICD-10-CM

## 2017-11-08 DIAGNOSIS — K603 Anal fistula: Secondary | ICD-10-CM

## 2017-11-08 LAB — CBC WITH DIFFERENTIAL/PLATELET
Basophils Absolute: 0.1 10*3/uL (ref 0.0–0.1)
Basophils Relative: 0.8 % (ref 0.0–3.0)
Eosinophils Absolute: 0 10*3/uL (ref 0.0–0.7)
Eosinophils Relative: 0.6 % (ref 0.0–5.0)
HCT: 43.5 % (ref 39.0–52.0)
Hemoglobin: 14.2 g/dL (ref 13.0–17.0)
Lymphocytes Relative: 25.8 % (ref 12.0–46.0)
Lymphs Abs: 2 10*3/uL (ref 0.7–4.0)
MCHC: 32.6 g/dL (ref 30.0–36.0)
MCV: 84.1 fl (ref 78.0–100.0)
Monocytes Absolute: 0.6 10*3/uL (ref 0.1–1.0)
Monocytes Relative: 7.7 % (ref 3.0–12.0)
Neutro Abs: 5 10*3/uL (ref 1.4–7.7)
Neutrophils Relative %: 65.1 % (ref 43.0–77.0)
Platelets: 341 10*3/uL (ref 150.0–400.0)
RBC: 5.17 Mil/uL (ref 4.22–5.81)
RDW: 16.2 % — ABNORMAL HIGH (ref 11.5–15.5)
WBC: 7.7 10*3/uL (ref 4.0–10.5)

## 2017-11-08 LAB — BASIC METABOLIC PANEL
BUN: 10 mg/dL (ref 6–23)
CO2: 29 mEq/L (ref 19–32)
Calcium: 9.4 mg/dL (ref 8.4–10.5)
Chloride: 102 mEq/L (ref 96–112)
Creatinine, Ser: 0.94 mg/dL (ref 0.40–1.50)
GFR: 110.19 mL/min (ref >60.00)
Glucose, Bld: 105 mg/dL — ABNORMAL HIGH (ref 70–99)
Potassium: 3.6 mEq/L (ref 3.5–5.1)
Sodium: 139 mEq/L (ref 135–145)

## 2017-11-08 LAB — HIGH SENSITIVITY CRP: CRP, High Sensitivity: 8 mg/L — ABNORMAL HIGH (ref 0.000–5.000)

## 2017-11-08 LAB — SEDIMENTATION RATE: Sed Rate: 40 mm/hr — ABNORMAL HIGH (ref 0–15)

## 2017-11-08 MED ORDER — CIPROFLOXACIN HCL 500 MG PO TABS: ORAL_TABLET | ORAL | 0 refills | Status: DC

## 2017-11-08 MED ORDER — METRONIDAZOLE 500 MG PO TABS: ORAL_TABLET | ORAL | 0 refills | Status: DC

## 2017-11-08 NOTE — Progress Notes (Addendum)
Subjective:    Patient ID: [name], male    DOB: [birth year], 47 y.o.   MRN: [id number]  HPI [name] is a pleasant 47 year old African-American male, new to GI today referred by Dr. Claudette Laws for evaluation of rectal pain and bleeding. Patient initially had an urgent care visit on 09/23/2017 at which time he said he had acute onset of rectal and groin pain after he had lifted a heavy piece of furniture. He developed some swelling of his right testicle and then also said he developed a lump near his rectum shortly after he had done the lifting. There was no obvious hernia on exam, and on rectal exam he was felt to have an external hemorrhoid that was nonthrombosed. He went to the emergency room on 09/26/2017 with persistent pain in the rectal and. Rectal area, and again was diagnosed with a hemorrhoid which was inflamed but not thrombosed and was also noted to have some induration to the right perirectal area adjacent to the hemorrhoid but no definite abscess. He underwent CT of the pelvis which showed right perianal soft tissue density measuring 3.2 x 2.8 cm this induration question phlegmon versus thrombosed hemorrhoid there is no discrete abscess. CBC was normal ER M.D. note states that he would be discharged on antibiotics, was also given NSAIDs and Ultram. Patient comes in today stating that his symptoms are persisting over the past month. He says he feels as if he may have opened up a hemorrhoid which then drained some pus and blood but since that time he has continued to have perianal pain especially with bowel movements. Continues to have a small amount of drainage from this area and notes blood and sometimes mucus on the tissue with bowel movements. He denies any current abdominal pain. He still feels as if there may be some swelling in his right testicle.  Patient has had 1 prior colonoscopy done in [place] about a year ago because of family history of colon cancer in his father  diagnosed at age 57 and his uncle diagnosed at 70. Patient says his colonoscopy was negative. Other medical problems include obstructive sleep apnea for which he uses CPAP, hyperlipidemia and hypertension  Review of Systems Pertinent positive and negative review of systems were noted in the above HPI section.  All other review of systems was otherwise negative.  Outpatient Encounter Medications as of 11/08/2017  Medication Sig  . amLODipine (NORVASC) 5 MG tablet Take 5 mg by mouth daily.  Marland Kitchen aspirin EC 81 MG tablet Take 81 mg by mouth every morning.  Marland Kitchen atorvastatin (LIPITOR) 40 MG tablet Take 40 mg by mouth daily.   . hydrochlorothiazide (HYDRODIURIL) 25 MG tablet Take 25 mg by mouth daily.   Marland Kitchen latanoprost (XALATAN) 0.005 % ophthalmic solution Place 1 drop into both eyes 2 (two) times daily.  Marland Kitchen LUMIGAN 0.01 % SOLN Place 1 drop into both eyes 2 (two) times daily.  . Multiple Vitamin (MULTIVITAMIN) capsule Take 1 capsule by mouth daily.  . timolol (TIMOPTIC) 0.5 % ophthalmic solution Place 1 drop into both eyes 2 (two) times daily.  . traMADol (ULTRAM) 50 MG tablet Take 1 tablet (50 mg total) by mouth every 6 (six) hours as needed.  . ciprofloxacin (CIPRO) 500 MG tablet Take 1 tab twice daily for 14 days.  . metroNIDAZOLE (FLAGYL) 500 MG tablet Take 1 tab twice daily for 14 days.  . [DISCONTINUED] acetaminophen (TYLENOL) 500 MG tablet Take 500 mg by mouth every 6 (six) hours as  needed for mild pain.  . [DISCONTINUED] doxycycline (VIBRAMYCIN) 100 MG capsule Take 1 capsule (100 mg total) by mouth 2 (two) times daily.  . [DISCONTINUED] hydrocortisone (ANUSOL-HC) 2.5 % rectal cream Place 1 application rectally 2 (two) times daily.  . [DISCONTINUED] ketoconazole (NIZORAL) 2 % cream Apply 1 application topically 2 (two) times daily. Apply to feet until clear  . [DISCONTINUED] Lidocaine, Anorectal, 5 % CREA Apply 1 Dose topically every 4 (four) hours as needed.   No facility-administered encounter  medications on file as of 11/08/2017.    Allergies  Allergen Reactions  . Reglan [Metoclopramide] Other (See Comments)    "made feel crazy"    There are no active problems to display for this patient.  Social History   Socioeconomic History  . Marital status: Married    Spouse name: Not on file  . Number of children: 5  . Years of education: BA  . Highest education level: Not on file  Social Needs  . Financial resource strain: Not on file  . Food insecurity - worry: Not on file  . Food insecurity - inability: Not on file  . Transportation needs - medical: Not on file  . Transportation needs - non-medical: Not on file  Occupational History  . Occupation: N/A  Tobacco Use  . Smoking status: Light Tobacco Smoker    Types: Cigars  . Smokeless tobacco: Never Used  Substance and Sexual Activity  . Alcohol use: No  . Drug use: No  . Sexual activity: Not on file  Other Topics Concern  . Not on file  Social History Narrative   Denies caffeine use     Mr. Danella PentonRobinson's family history includes Diabetes in his father and mother; Hypertension in his father and mother.      Objective:    Vitals:   11/08/17 0900  BP: 136/78  Pulse: 70    Physical Exam; well-developed morbidly obese African-American male in no acute distress, pleasant blood pressure 136/78, pulse 70, height 5 foot 5, weight 339, BMI of 55.5. HEENT; nontraumatic normocephalic EOMI PERRLA sclera anicteric, Cardiovascular; regular rate and rhythm with S1-S2 no murmur rub or gallop, Pulmonary; clear bilaterally, Abdomen; morbidly obese, soft no focal tenderness no palpable mass or hepatosplenomegaly bowel sounds are present, no palpable abnormality in the right groin,, Rectal ;exam patient has 2 small fistulous openings in the right perianal area, anteriorly at about the 4:00 position,  tender, no drainage observed. He is very tender on digital exam, no stool in rectal vault and no palpable lesion. There is no evidence of  any induration or fluctuance in the right perianal and right gluteal area., Extremities; no clubbing cyanosis or edema skin warm and dry, Neuropsych mood and affect appropriate       Assessment & Plan:   #221 47 year old African-American male with 2 month history of right perianal and rectal pain. CT of the pelvis on 09/26/2017 showed right perianal soft tissue density measuring 3.2 x 2.8 cm unclear whether this represented phlegmon or thrombosed hemorrhoid there was no discrete abscess. Sounds his if patient may have received a short course of antibiotics. He is not having any further swelling of his gluteal area but continues to have pain, small amounts of drainage and bleeding. On exam he appears to have 2 fistulous openings in the right perianal area anteriorly at about the 4:00 position. Unclear whether he may have any persistent internal abscess, there is no evidence of cellulitis, induration or fluctuance externally.  #2 family history  of colon cancer in patient's father and uncle. Patient had colonoscopy about one year ago which was reportedly negative, we will obtain copy of this report  #3 morbid obesity with BMI 55.5 #4 hypertension #5 obstructive sleep apnea with CPAP used #6 hypertension  Plan; CBC, sedimentation rate and CRP Patient will be scheduled for a pelvic MRI. Dr. Rhea Belton examined the patient with me today, and we discussed plan. He will also be scheduled for flexible sigmoidoscopy with Dr. Rhea Belton at [facility], due to BMI of 55.  Patient will be given a course of Cipro 500 mg by mouth twice a day and Flagyl 500 mg by mouth twice a day 14 days. He may ultimately require surgical intervention.  Patient signed a release and will obtain copy of his previous colonoscopy.  Rhyleigh Grassel S Bilbo Carcamo PA-C 11/08/2017   Cc: Leilani Able, MD   Addendum: Reviewed and agree with initial management. Pyrtle, Carie Caddy, MD

## 2017-11-08 NOTE — Patient Instructions (Addendum)
Please go to the basement level to have your labs drawn.  We sent prescriptions to your pharmacy. 1. Cipro 500 mg 2. Flagyl ( Metronidazole) 500 mg  I If you are age 48 or younger, your body mass index should be between 19-25. Your Body mass index is 55.55 kg/m. If this is out of the aformentioned range listed, please consider follow up with your Primary Care Provider.   You have been scheduled for an MRI at [facility] on Sat 11-11-2017. Your appointment time is 3:00 PM. Please arrive 15 minutes prior to your appointment time for registration purposes. Please make certain not to have anything to eat or drink 6 hours prior to your test. In addition, if you have any metal in your body, have a pacemaker or defibrillator, please be sure to let your ordering physician know. This test typically takes 45 minutes to 1 hour to complete. Should you need to reschedule, please call [phone] to do so.  For the MRI at [facility], Go through the Emergency Room but tell them you are there for an outpatient MRI, not for an ER visit.   You have been scheduled for a flexible sigmoidoscopy. Please follow the written instructions given to you at your visit today. If you use inhalers (even only as needed), please bring them with you on the day of your procedure.

## 2017-11-08 NOTE — H&P (Signed)
Patient words: CC: Persistent perianal pain  HPI: [name] is a very pleasant 47yo gentleman here today for f/u. He has a hx of perianal abscess, seen on CT 08/2017 that he was seen in the office for by my partner - the abscess spontanesously drained.  He was seen back by myself 10/03/17 and had an abscess cavity but no further drainage/pain. time he said that the right perianal area has opened and drained bloody and purulent fluid.  Since that time he has had intermittent perianal pain and drainage.  He denies any fever or chills.  Today he feels close to his baseline.  He does note a prior perianal abscess that he believes may have been the same region a couple of years ago that required drainage. He reports having had a colonoscopy 1 year ago that was reportedly normal-no polyps or other concerning findings.  PMH: Hypertension well controlled on oral antihypertensives  PSH: I&D of perianal abscess couple of years ago  FHx: Denies FHx of malignancy  Social: Denies use of tobacco/EtOH/drugs  ROS: A comprehensive 10 system review of systems was completed with the patient and pertinent findings as noted above.  The patient is a 47 year old male.   Medication History Ethlyn Gallery(Alisha Spillers, CMA; 11/08/2017 2:30 PM) Medications Reconciled     Review of Systems Cristal Deer(Sesilia Poucher M. Kilie Rund MD; 11/08/2017 3:12 PM) General Not Present- Appetite Loss, Chills, Fatigue, Fever, Night Sweats, Weight Gain and Weight Loss. Skin Not Present- Change in Wart/Mole, Dryness, Hives, Jaundice, New Lesions, Non-Healing Wounds, Rash and Ulcer. HEENT Not Present- Earache, Hearing Loss, Hoarseness, Nose Bleed, Oral Ulcers, Ringing in the Ears, Seasonal Allergies, Sinus Pain, Sore Throat, Visual Disturbances, Wears glasses/contact lenses and Yellow Eyes. Neck Not Present- Neck Mass and Neck Pain. Respiratory Not Present- Bloody sputum, Chronic Cough, Difficulty Breathing, Snoring and Wheezing. Cardiovascular Not Present-  Chest Pain, Difficulty Breathing Lying Down, Leg Cramps, Palpitations, Rapid Heart Rate, Shortness of Breath and Swelling of Extremities. Gastrointestinal Present- Constipation and Hemorrhoids. Not Present- Abdominal Pain, Bloating, Bloody Stool, Change in Bowel Habits, Chronic diarrhea, Difficulty Swallowing, Excessive gas, Gets full quickly at meals, Indigestion, Nausea, Rectal Pain and Vomiting. Musculoskeletal Not Present- Back Pain, Joint Pain, Joint Stiffness, Muscle Pain, Muscle Weakness and Swelling of Extremities. Psychiatric Not Present- Anxiety and Depression. Endocrine Not Present- Cold Intolerance, Excessive Hunger, Hair Changes, Heat Intolerance and New Diabetes. Hematology Not Present- Blood Thinners, Easy Bruising, Excessive bleeding, Gland problems, HIV and Persistent Infections.  Vitals (Alisha Spillers CMA; 11/08/2017 2:28 PM) 11/08/2017 2:28 PM Weight: 339.6 lb   Height: 64 in  Body Surface Area: 2.45 m   Body Mass Index: 58.29 kg/m   Pulse: 82 (Regular)    BP: 118/70 (Sitting, Left Arm, Standard)       Physical Exam Cristal Deer(Areej Tayler M. Harol Shabazz MD; 11/08/2017 3:12 PM) The physical exam findings are as follows: Note: Constitutional: No acute distress; conversant; no deformities Eyes: Moist conjunctiva; no lid lag; anicteric sclerae; pupils equal round and reactive to light Neck: Trachea midline; no palpable thyromegaly Lungs: Normal respiratory effort; no tactile fremitus CV: Regular rate and rhythm; no palpable thrill; no pitting edema GI: Abdomen soft, nontender, nondistended; no palpable hepatosplenomegaly Anorectal: Right lateral perianal skin with punctate opening and granulation tissue emanating from this. 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 On digital rectal examination no palpable masses or lesions but he was unable to really tolerate this and so anoscopy was not attempted. There is currently no drainage of purulent  fluid or palpable fluctuance in the  anal area. MSK: Normal gait; no clubbing/cyanosis Psychiatric: Appropriate affect; alert and oriented 3 Lymphatic: No palpable cervical or axillary lymphadenopathy    Assessment & Plan Cristal Deer M. 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 is a very pleasant 47yo gentleman with history of renal abscess a year ago with drainage resolution. He returned with recurrent symptoms in the past 2 months. Since that time he has had intermittent drainage and now what appears to be a external opening of a fistulous tract. -We'll proceed with scheduling for exam under anesthesia, possible incision and drainage, possible seton placement; all other indicated procedures. -The anatomy and physiology of the GI tract was discussed at length with the patient. The pathophysiology of anal abscess/fistula was discussed at length with associated pictures and illustrations. We discussed use of setons and drainage of these and their role in his treatment. We discussed that fistulous disease can require multiple operations to treat an additional admission would be available following a more adequate exam under anesthesia -The planned procedure, material risks (including, but not limited to, pain, bleeding, infection, scarring, need for blood transfusion, damage to anal sphincter, incontinence of gas and/or stool, need for additional procedures, recurrence, pneumonia, heart attack, stroke, death) benefits and alternatives to surgery were discussed at length. I noted a good probability that the procedure would help improve their symptoms. The patient's questions were answered to their satisfaction and they elected to proceed with surgery.  Signed electronically by Andria Meuse, MD (11/08/2017 3:15 PM)

## 2017-11-11 ENCOUNTER — Ambulatory Visit (HOSPITAL_COMMUNITY)
Admission: RE | Admit: 2017-11-11 | Discharge: 2017-11-11 | Disposition: A | Discharge status: Home / Self Care | Payer: Medicaid Other | Source: Ambulatory Visit | Attending: Physician Assistant | Admitting: Physician Assistant

## 2017-11-11 ENCOUNTER — Encounter (HOSPITAL_COMMUNITY): Payer: Self-pay

## 2017-11-11 ENCOUNTER — Encounter (HOSPITAL_COMMUNITY): Payer: Self-pay | Admitting: Radiology

## 2017-11-11 DIAGNOSIS — K6289 Other specified diseases of anus and rectum: Secondary | ICD-10-CM | POA: Insufficient documentation

## 2017-11-11 DIAGNOSIS — K603 Anal fistula: Secondary | ICD-10-CM | POA: Diagnosis present

## 2017-11-11 DIAGNOSIS — R9389 Abnormal findings on diagnostic imaging of other specified body structures: Secondary | ICD-10-CM | POA: Insufficient documentation

## 2017-11-29 NOTE — Patient Instructions (Signed)
Marlan PalauRonald M Cajuste  11/29/2017   Your procedure is scheduled on:  12/08/2017   Report to [facility] Main  Entrance  Report to admitting at   0800 AM   Call this number if you have problems the morning of surgery [phone]   Remember: Do not eat food or drink liquids :After Midnight.  Fleets enema night before surgery.              Fleets enema am of surgery.    Take these medicines the morning of surgery with A SIP OF WATER: Amlodipine ( Norvasc), Eye drops as usual                                 You may not have any metal on your body including hair pins and              piercings  Do not wear jewelry,  lotions, powders or perfumes, deodorant                          Men may shave face and neck.   Do not bring valuables to the hospital. [facility] IS NOT             RESPONSIBLE   FOR VALUABLES.  Contacts, dentures or bridgework may not be worn into surgery.       Patients discharged the day of surgery will not be allowed to drive home.  Name and phone number of your driver:                Please read over the following fact sheets you were given: _____________________________________________________________________             [facility] - Preparing for Surgery Before surgery, you can play an important role.  Because skin is not sterile, your skin needs to be as free of germs as possible.  You can reduce the number of germs on your skin by washing with CHG (chlorahexidine gluconate) soap before surgery.  CHG is an antiseptic cleaner which kills germs and bonds with the skin to continue killing germs even after washing. Please DO NOT use if you have an allergy to CHG or antibacterial soaps.  If your skin becomes reddened/irritated stop using the CHG and inform your nurse when you arrive at Short Stay. Do not shave (including legs and underarms) for at least 48 hours prior to the first CHG shower.  You may shave your face/neck. Please follow these  instructions carefully:  1.  Shower with CHG Soap the night before surgery and the  morning of Surgery.  2.  If you choose to wash your hair, wash your hair first as usual with your  normal  shampoo.  3.  After you shampoo, rinse your hair and body thoroughly to remove the  shampoo.                           4.  Use CHG as you would any other liquid soap.  You can apply chg directly  to the skin and wash                       Gently with a scrungie or clean washcloth.  5.  Apply  the CHG Soap to your body ONLY FROM THE NECK DOWN.   Do not use on face/ open                           Wound or open sores. Avoid contact with eyes, ears mouth and genitals (private parts).                       Wash face,  Genitals (private parts) with your normal soap.             6.  Wash thoroughly, paying special attention to the area where your surgery  will be performed.  7.  Thoroughly rinse your body with warm water from the neck down.  8.  DO NOT shower/wash with your normal soap after using and rinsing off  the CHG Soap.                9.  Pat yourself dry with a clean towel.            10.  Wear clean pajamas.            11.  Place clean sheets on your bed the night of your first shower and do not  sleep with pets. Day of Surgery : Do not apply any lotions/deodorants the morning of surgery.  Please wear clean clothes to the hospital/surgery center.  FAILURE TO FOLLOW THESE INSTRUCTIONS MAY RESULT IN THE CANCELLATION OF YOUR SURGERY PATIENT SIGNATURE_________________________________  NURSE SIGNATURE__________________________________  ________________________________________________________________________

## 2017-11-30 ENCOUNTER — Inpatient Hospital Stay (HOSPITAL_COMMUNITY)
Admission: RE | Admit: 2017-11-30 | Discharge: 2017-11-30 | Disposition: A | Discharge status: Home / Self Care | Payer: Medicaid Other | Source: Ambulatory Visit

## 2017-12-04 NOTE — Patient Instructions (Addendum)
[name]  12/04/2017   Your procedure is scheduled on: 12/08/2017   Report to [facility] Main  Entrance  Report to admitting at   0800 AM   Call this number if you have problems the morning of surgery [phone]   Remember: Do not eat food or drink liquids :After Midnight.               Fleets enema night before and am of surgery.   Take these medicines the morning of surgery with A SIP OF WATER: Amlodipine ( NORvasc)                                 You may not have any metal on your body including hair pins and              piercings  Do not wear jewelry, , lotions, powders or perfumes, deodorant                         Men may shave face and neck.   Do not bring valuables to the hospital. [facility] IS NOT             RESPONSIBLE   FOR VALUABLES.  Contacts, dentures or bridgework may not be worn into surgery.       Patients discharged the day of surgery will not be allowed to drive home.  Name and phone number of your driver:  Special Instructions: N/A              Please read over the following fact sheets you were given: _____________________________________________________________________             [facility] - Preparing for Surgery Before surgery, you can play an important role.  Because skin is not sterile, your skin needs to be as free of germs as possible.  You can reduce the number of germs on your skin by washing with CHG (chlorahexidine gluconate) soap before surgery.  CHG is an antiseptic cleaner which kills germs and bonds with the skin to continue killing germs even after washing. Please DO NOT use if you have an allergy to CHG or antibacterial soaps.  If your skin becomes reddened/irritated stop using the CHG and inform your nurse when you arrive at Short Stay. Do not shave (including legs and underarms) for at least 48 hours prior to the first CHG shower.  You may shave your face/neck. Please follow these instructions  carefully:  1.  Shower with CHG Soap the night before surgery and the  morning of Surgery.  2.  If you choose to wash your hair, wash your hair first as usual with your  normal  shampoo.  3.  After you shampoo, rinse your hair and body thoroughly to remove the  shampoo.                           4.  Use CHG as you would any other liquid soap.  You can apply chg directly  to the skin and wash                       Gently with a scrungie or clean washcloth.  5.  Apply the CHG Soap to your body ONLY  FROM THE NECK DOWN.   Do not use on face/ open                           Wound or open sores. Avoid contact with eyes, ears mouth and genitals (private parts).                       Wash face,  Genitals (private parts) with your normal soap.             6.  Wash thoroughly, paying special attention to the area where your surgery  will be performed.  7.  Thoroughly rinse your body with warm water from the neck down.  8.  DO NOT shower/wash with your normal soap after using and rinsing off  the CHG Soap.                9.  Pat yourself dry with a clean towel.            10.  Wear clean pajamas.            11.  Place clean sheets on your bed the night of your first shower and do not  sleep with pets. Day of Surgery : Do not apply any lotions/deodorants the morning of surgery.  Please wear clean clothes to the hospital/surgery center.  FAILURE TO FOLLOW THESE INSTRUCTIONS MAY RESULT IN THE CANCELLATION OF YOUR SURGERY PATIENT SIGNATURE_________________________________  NURSE SIGNATURE__________________________________  ________________________________________________________________________

## 2017-12-06 ENCOUNTER — Encounter (HOSPITAL_COMMUNITY): Payer: Self-pay

## 2017-12-06 ENCOUNTER — Encounter (HOSPITAL_COMMUNITY)
Admission: RE | Admit: 2017-12-06 | Discharge: 2017-12-06 | Disposition: A | Discharge status: Home / Self Care | Payer: Medicaid Other | Source: Ambulatory Visit | Attending: Surgery | Admitting: Surgery

## 2017-12-06 ENCOUNTER — Other Ambulatory Visit: Payer: Self-pay

## 2017-12-06 DIAGNOSIS — K610 Anal abscess: Secondary | ICD-10-CM | POA: Diagnosis not present

## 2017-12-06 DIAGNOSIS — Z01818 Encounter for other preprocedural examination: Secondary | ICD-10-CM | POA: Insufficient documentation

## 2017-12-06 HISTORY — DX: Edema, unspecified: R60.9

## 2017-12-06 HISTORY — DX: Gastro-esophageal reflux disease without esophagitis: K21.9

## 2017-12-06 HISTORY — DX: Prediabetes: R73.03

## 2017-12-06 HISTORY — DX: Adverse effect of unspecified anesthetic, initial encounter: T41.45XA

## 2017-12-06 HISTORY — DX: Pneumonia, unspecified organism: J18.9

## 2017-12-06 HISTORY — DX: Other complications of anesthesia, initial encounter: T88.59XA

## 2017-12-06 HISTORY — DX: Unspecified osteoarthritis, unspecified site: M19.90

## 2017-12-06 HISTORY — DX: Sleep apnea, unspecified: G47.30

## 2017-12-06 LAB — COMPREHENSIVE METABOLIC PANEL
ALT: 36 U/L (ref 17–63)
AST: 27 U/L (ref 15–41)
Albumin: 3.8 g/dL (ref 3.5–5.0)
Alkaline Phosphatase: 81 U/L (ref 38–126)
Anion gap: 9 (ref 5–15)
BUN: 11 mg/dL (ref 6–20)
CO2: 26 mmol/L (ref 22–32)
Calcium: 8.9 mg/dL (ref 8.9–10.3)
Chloride: 103 mmol/L (ref 101–111)
Creatinine, Ser: 0.86 mg/dL (ref 0.61–1.24)
GFR calc Af Amer: >60 mL/min (ref >60)
GFR calc non Af Amer: >60 mL/min (ref >60)
Glucose, Bld: 125 mg/dL — ABNORMAL HIGH (ref 65–99)
Potassium: 3.9 mmol/L (ref 3.5–5.1)
Sodium: 138 mmol/L (ref 135–145)
Total Bilirubin: 0.7 mg/dL (ref 0.3–1.2)
Total Protein: 7.8 g/dL (ref 6.5–8.1)

## 2017-12-06 LAB — CBC WITH DIFFERENTIAL/PLATELET
Basophils Absolute: 0 10*3/uL (ref 0.0–0.1)
Basophils Relative: 0 %
Eosinophils Absolute: 0.1 10*3/uL (ref 0.0–0.7)
Eosinophils Relative: 1 %
HCT: 41.1 % (ref 39.0–52.0)
Hemoglobin: 13.8 g/dL (ref 13.0–17.0)
Lymphocytes Relative: 25 %
Lymphs Abs: 1.8 10*3/uL (ref 0.7–4.0)
MCH: 27.7 pg (ref 26.0–34.0)
MCHC: 33.6 g/dL (ref 30.0–36.0)
MCV: 82.4 fL (ref 78.0–100.0)
Monocytes Absolute: 0.5 10*3/uL (ref 0.1–1.0)
Monocytes Relative: 8 %
Neutro Abs: 4.6 10*3/uL (ref 1.7–7.7)
Neutrophils Relative %: 66 %
Platelets: 332 10*3/uL (ref 150–400)
RBC: 4.99 MIL/uL (ref 4.22–5.81)
RDW: 15.6 % — ABNORMAL HIGH (ref 11.5–15.5)
WBC: 6.9 10*3/uL (ref 4.0–10.5)

## 2017-12-06 LAB — PROTIME-INR
INR: 1
Prothrombin Time: 13.1 seconds (ref 11.4–15.2)

## 2017-12-06 LAB — HEMOGLOBIN A1C
Hgb A1c MFr Bld: 6.7 % — ABNORMAL HIGH (ref 4.8–5.6)
Mean Plasma Glucose: 145.59 mg/dL

## 2017-12-06 LAB — APTT: aPTT: 32 seconds (ref 24–36)

## 2017-12-06 NOTE — Progress Notes (Signed)
EKG - 09/26/17-epic  CXR-09/26/17-epic

## 2017-12-07 NOTE — Progress Notes (Signed)
Fina EKG done 12/06/17-epic

## 2017-12-08 ENCOUNTER — Ambulatory Visit (HOSPITAL_COMMUNITY)
Admission: RE | Admit: 2017-12-08 | Discharge: 2017-12-08 | Disposition: A | Discharge status: Home / Self Care | Payer: Medicaid Other | Source: Ambulatory Visit | Attending: Surgery | Admitting: Surgery

## 2017-12-08 ENCOUNTER — Encounter (HOSPITAL_COMMUNITY): Payer: Self-pay | Admitting: *Deleted

## 2017-12-08 ENCOUNTER — Ambulatory Visit (HOSPITAL_COMMUNITY): Payer: Medicaid Other | Admitting: Certified Registered"

## 2017-12-08 ENCOUNTER — Encounter (HOSPITAL_COMMUNITY)
Admission: RE | Disposition: A | Discharge status: Home / Self Care | Payer: Self-pay | Source: Ambulatory Visit | Attending: Surgery

## 2017-12-08 DIAGNOSIS — I10 Essential (primary) hypertension: Secondary | ICD-10-CM | POA: Insufficient documentation

## 2017-12-08 DIAGNOSIS — Z7982 Long term (current) use of aspirin: Secondary | ICD-10-CM | POA: Insufficient documentation

## 2017-12-08 DIAGNOSIS — Z888 Allergy status to other drugs, medicaments and biological substances status: Secondary | ICD-10-CM | POA: Insufficient documentation

## 2017-12-08 DIAGNOSIS — K603 Anal fistula: Secondary | ICD-10-CM | POA: Diagnosis not present

## 2017-12-08 DIAGNOSIS — K612 Anorectal abscess: Secondary | ICD-10-CM | POA: Diagnosis present

## 2017-12-08 DIAGNOSIS — Z6841 Body Mass Index (BMI) 40.0 and over, adult: Secondary | ICD-10-CM | POA: Insufficient documentation

## 2017-12-08 DIAGNOSIS — Z79899 Other long term (current) drug therapy: Secondary | ICD-10-CM | POA: Insufficient documentation

## 2017-12-08 SURGERY — EXAM UNDER ANESTHESIA
Anesthesia: General | Site: Rectum

## 2017-12-08 MED ORDER — BUPIVACAINE-EPINEPHRINE 0.25% -1:200000 IJ SOLN
INTRAMUSCULAR | Status: AC
  Filled 2017-12-08: qty 1

## 2017-12-08 MED ORDER — MIDAZOLAM HCL 5 MG/5ML IJ SOLN
INTRAMUSCULAR | Status: DC | PRN
  Administered 2017-12-08: 2 mg via INTRAVENOUS

## 2017-12-08 MED ORDER — PROPOFOL 10 MG/ML IV BOLUS
INTRAVENOUS | Status: AC
  Filled 2017-12-08: qty 20

## 2017-12-08 MED ORDER — 0.9 % SODIUM CHLORIDE (POUR BTL) OPTIME
TOPICAL | Status: DC | PRN
  Administered 2017-12-08: 1000 mL

## 2017-12-08 MED ORDER — SUGAMMADEX SODIUM 500 MG/5ML IV SOLN
INTRAVENOUS | Status: DC | PRN
  Administered 2017-12-08: 325 mg via INTRAVENOUS

## 2017-12-08 MED ORDER — ACETAMINOPHEN 500 MG PO TABS
1000.0000 mg | ORAL_TABLET | ORAL | Status: AC
  Administered 2017-12-08: 1000 mg via ORAL
  Filled 2017-12-08: qty 2

## 2017-12-08 MED ORDER — LIDOCAINE HCL 2 % EX GEL
CUTANEOUS | Status: AC
  Filled 2017-12-08: qty 5

## 2017-12-08 MED ORDER — ROCURONIUM BROMIDE 10 MG/ML (PF) SYRINGE
PREFILLED_SYRINGE | INTRAVENOUS | Status: AC
  Filled 2017-12-08: qty 5

## 2017-12-08 MED ORDER — BUPIVACAINE LIPOSOME 1.3 % IJ SUSP
INTRAMUSCULAR | Status: DC | PRN
  Administered 2017-12-08: 20 mL

## 2017-12-08 MED ORDER — ROCURONIUM BROMIDE 10 MG/ML (PF) SYRINGE
PREFILLED_SYRINGE | INTRAVENOUS | Status: DC | PRN
  Administered 2017-12-08: 50 mg via INTRAVENOUS

## 2017-12-08 MED ORDER — ONDANSETRON HCL 4 MG/2ML IJ SOLN
INTRAMUSCULAR | Status: AC
  Filled 2017-12-08: qty 2

## 2017-12-08 MED ORDER — SODIUM CHLORIDE 0.9 % IJ SOLN
INTRAMUSCULAR | Status: AC
  Filled 2017-12-08: qty 50

## 2017-12-08 MED ORDER — CHLORHEXIDINE GLUCONATE CLOTH 2 % EX PADS: 6.0000 | MEDICATED_PAD | Freq: Once | CUTANEOUS | Status: DC

## 2017-12-08 MED ORDER — FLEET ENEMA 7-19 GM/118ML RE ENEM
1.0000 | ENEMA | Freq: Once | RECTAL | Status: DC
  Filled 2017-12-08: qty 1

## 2017-12-08 MED ORDER — LIDOCAINE 2% (20 MG/ML) 5 ML SYRINGE
INTRAMUSCULAR | Status: DC | PRN
  Administered 2017-12-08: 100 mg via INTRAVENOUS

## 2017-12-08 MED ORDER — PROPOFOL 10 MG/ML IV BOLUS
INTRAVENOUS | Status: DC | PRN
  Administered 2017-12-08: 300 mg via INTRAVENOUS

## 2017-12-08 MED ORDER — FENTANYL CITRATE (PF) 100 MCG/2ML IJ SOLN
INTRAMUSCULAR | Status: DC | PRN
  Administered 2017-12-08 (×3): 50 ug via INTRAVENOUS
  Administered 2017-12-08: 100 ug via INTRAVENOUS

## 2017-12-08 MED ORDER — LIDOCAINE 2% (20 MG/ML) 5 ML SYRINGE
INTRAMUSCULAR | Status: AC
  Filled 2017-12-08: qty 5

## 2017-12-08 MED ORDER — FENTANYL CITRATE (PF) 250 MCG/5ML IJ SOLN
INTRAMUSCULAR | Status: AC
  Filled 2017-12-08: qty 5

## 2017-12-08 MED ORDER — BUPIVACAINE LIPOSOME 1.3 % IJ SUSP
20.0000 mL | Freq: Once | INTRAMUSCULAR | Status: DC
  Filled 2017-12-08: qty 20

## 2017-12-08 MED ORDER — LACTATED RINGERS IV SOLN
INTRAVENOUS | Status: DC
  Administered 2017-12-08: 09:00:00 via INTRAVENOUS

## 2017-12-08 MED ORDER — SUGAMMADEX SODIUM 500 MG/5ML IV SOLN
INTRAVENOUS | Status: AC
  Filled 2017-12-08: qty 5

## 2017-12-08 MED ORDER — MIDAZOLAM HCL 2 MG/2ML IJ SOLN
INTRAMUSCULAR | Status: AC
  Filled 2017-12-08: qty 2

## 2017-12-08 MED ORDER — BUPIVACAINE-EPINEPHRINE 0.25% -1:200000 IJ SOLN
INTRAMUSCULAR | Status: DC | PRN
  Administered 2017-12-08: 50 mL

## 2017-12-08 MED ORDER — SUCCINYLCHOLINE CHLORIDE 200 MG/10ML IV SOSY
PREFILLED_SYRINGE | INTRAVENOUS | Status: DC | PRN
  Administered 2017-12-08: 160 mg via INTRAVENOUS

## 2017-12-08 MED ORDER — DEXAMETHASONE SODIUM PHOSPHATE 10 MG/ML IJ SOLN
INTRAMUSCULAR | Status: AC
  Filled 2017-12-08: qty 1

## 2017-12-08 MED ORDER — GABAPENTIN 300 MG PO CAPS
300.0000 mg | ORAL_CAPSULE | ORAL | Status: AC
  Administered 2017-12-08: 300 mg via ORAL
  Filled 2017-12-08: qty 1

## 2017-12-08 SURGICAL SUPPLY — 45 items
APL SKNCLS STERI-STRIP NONHPOA (GAUZE/BANDAGES/DRESSINGS) ×4
BENZOIN TINCTURE PRP APPL 2/3 (GAUZE/BANDAGES/DRESSINGS) ×6 IMPLANT
BLADE EXTENDED COATED 6.5IN (ELECTRODE) IMPLANT
BLADE HEX COATED 2.75 (ELECTRODE) ×3 IMPLANT
BLADE SURG 15 STRL LF DISP TIS (BLADE) IMPLANT
BLADE SURG 15 STRL SS (BLADE)
BRIEF STRETCH FOR OB PAD LRG (UNDERPADS AND DIAPERS) ×6 IMPLANT
DECANTER SPIKE VIAL GLASS SM (MISCELLANEOUS) ×3 IMPLANT
DRAPE UTILITY XL STRL (DRAPES) ×3 IMPLANT
ELECT REM PT RETURN 9FT ADLT (ELECTROSURGICAL) ×3
ELECTRODE REM PT RTRN 9FT ADLT (ELECTROSURGICAL) ×2 IMPLANT
GAUZE SPONGE 4X4 12PLY STRL (GAUZE/BANDAGES/DRESSINGS) ×2 IMPLANT
GAUZE SPONGE 4X4 16PLY XRAY LF (GAUZE/BANDAGES/DRESSINGS) IMPLANT
GLOVE BIO SURGEON STRL SZ7.5 (GLOVE) ×3 IMPLANT
GLOVE INDICATOR 8.0 STRL GRN (GLOVE) ×3 IMPLANT
GOWN STRL REUS W/TWL LRG LVL3 (GOWN DISPOSABLE) ×3 IMPLANT
KIT BASIN OR (CUSTOM PROCEDURE TRAY) ×2 IMPLANT
LOOP VESSEL MAXI BLUE (MISCELLANEOUS) ×2 IMPLANT
NDL SAFETY ECLIPSE 18X1.5 (NEEDLE) IMPLANT
NEEDLE HYPO 18GX1.5 SHARP (NEEDLE)
NEEDLE HYPO 22GX1.5 SAFETY (NEEDLE) ×3 IMPLANT
PACK GENERAL/GYN (CUSTOM PROCEDURE TRAY) ×2 IMPLANT
PAD ABD 7.5X8 STRL (GAUZE/BANDAGES/DRESSINGS) ×2 IMPLANT
PAD ABD 8X10 STRL (GAUZE/BANDAGES/DRESSINGS) ×3 IMPLANT
PENCIL BUTTON HOLSTER BLD 10FT (ELECTRODE) ×3 IMPLANT
SPONGE GAUZE 4X4 12PLY STER LF (GAUZE/BANDAGES/DRESSINGS) ×3 IMPLANT
SPONGE HEMORRHOID 8X3CM (HEMOSTASIS) ×2 IMPLANT
SPONGE SURGIFOAM ABS GEL 12-7 (HEMOSTASIS) IMPLANT
SUCTION FRAZIER HANDLE 10FR (MISCELLANEOUS) ×1
SUCTION TUBE FRAZIER 10FR DISP (MISCELLANEOUS) ×1 IMPLANT
SUT CHROMIC 2 0 SH (SUTURE) IMPLANT
SUT CHROMIC 3 0 SH 27 (SUTURE) IMPLANT
SUT ETHIBOND 0 (SUTURE) IMPLANT
SUT MNCRL AB 4-0 PS2 18 (SUTURE) IMPLANT
SUT SILK 2 0 (SUTURE)
SUT SILK 2-0 18XBRD TIE 12 (SUTURE) IMPLANT
SUT VIC AB 2-0 SH 27 (SUTURE)
SUT VIC AB 2-0 SH 27XBRD (SUTURE) IMPLANT
SUT VIC AB 3-0 SH 18 (SUTURE) IMPLANT
SUT VIC AB 4-0 P-3 18XBRD (SUTURE) IMPLANT
SUT VIC AB 4-0 P3 18 (SUTURE)
SYR CONTROL 10ML LL (SYRINGE) ×3 IMPLANT
TAPE CLOTH SURG 4X10 WHT LF (GAUZE/BANDAGES/DRESSINGS) ×2 IMPLANT
TOWEL OR 17X24 6PK STRL BLUE (TOWEL DISPOSABLE) ×3 IMPLANT
YANKAUER SUCT BULB TIP NO VENT (SUCTIONS) ×3 IMPLANT

## 2017-12-08 NOTE — H&P (Signed)
H&P from 11/08/17 was reviewed by myself with the patient. There are not reported interval changes.  Vitals:   12/08/17 0812 12/08/17 0838  BP: (P) 138/66   Pulse: 85   Resp: 20   Temp: 97.8 F (36.6 C)   TempSrc: Oral   SpO2: 99%   Weight:  (!) 142.9 kg (315 lb)  Height:  5\' 6"  (1.676 m)  GEN: NAD, comfortable CV:RRR  PLAN Mr. Roxan HockeyRobinson is a very pleasant 47yo gentleman with history of perirectal abscess a year ago with drainage resolution. He returned with recurrent symptoms in the past 2 months. Since that time he has had intermittent drainage and now what appears to be a external opening of a fistulous tract.  -The anatomy and physiology of the GI tract was discussed at length with the patient. The pathophysiology of anal abscess/fistula was discussed at length with associated pictures and illustrations. We discussed use of setons and drainage of these and their role in his treatment. We discussed that fistulous disease can require multiple operations to treat an additional admission would be available following a more adequate exam under anesthesia -The planned procedure, material risks (including, but not limited to, pain, bleeding, infection, scarring, need for blood transfusion, damage to anal sphincter, incontinence of gas and/or stool, need for additional procedures, recurrence, pneumonia, heart attack, stroke, death) benefits and alternatives to surgery were discussed at length. I noted a good probability that the procedure would help improve their symptoms. The patient's questions were answered to their satisfaction and they elected to proceed with surgery.  Stephanie Couphristopher M. Cliffton AstersWhite, M.D. General and Colorectal Surgery [facility]

## 2017-12-08 NOTE — Transfer of Care (Signed)
Immediate Anesthesia Transfer of Care Note  Patient: [name]  Procedure(s) Performed: ANAL EXAM UNDER ANESTHESIA, FISTULOTOMY (N/A Rectum)  Patient Location: PACU  Anesthesia Type:General  Level of Consciousness: awake, alert  and oriented  Airway & Oxygen Therapy: Patient Spontanous Breathing and Patient connected to face mask oxygen  Post-op Assessment: Report given to RN and Post -op Vital signs reviewed and stable  Post vital signs: Reviewed and stable  Last Vitals:  Vitals:   12/08/17 0812  BP: (P) 138/66  Pulse: 85  Resp: 20  Temp: 36.6 C  SpO2: 99%    Last Pain:  Vitals:   12/08/17 0838  TempSrc:   PainSc: 7          Complications: No apparent anesthesia complications

## 2017-12-08 NOTE — Op Note (Signed)
12/08/2017  10:29 AM  PATIENT:  [name]  47 y.o. male  Patient Care Team: System, Pcp Not In as PCP - General  PRE-OPERATIVE DIAGNOSIS:  Anal abscess/fistula  POST-OPERATIVE DIAGNOSIS:  Anal abscess/fistula  PROCEDURE:   1. Anal exam under anesthesia 2. Fistulotomy  SURGEON:  Surgeon(s): Andria Meuse, MD  ASSISTANT: none   ANESTHESIA:   general  SPECIMEN:  No Specimen  DISPOSITION OF SPECIMEN:  N/A  COUNTS:  YES-counts reported correct x2 the conclusion of the procedure  PLAN OF CARE: Discharge to home after PACU  PATIENT DISPOSITION:  PACU - hemodynamically stable.  INDICATION: [name] is a very pleasant 47yo gentleman with history of perirectal abscess a year ago with drainage resolution. He returned with recurrent symptoms in the past 2 months. Since that time he has had intermittent drainage and now what appears to be a external opening of a fistulous tract. He was suspected of having a perianal fistula and as such was recommended an exam under anesthesia and possible additional procedures to address.  The anatomy and physiology of the GI tract was discussed at length with the patient. The pathophysiology of anal abscess/fistula was discussed at length with associated pictures and illustrations. We discussed use of setons and drainage of these and their role in his treatment. We discussed that fistulous disease can require multiple operations to treat an additional admission would be available following a more adequate exam under anesthesia  The planned procedure, material risks (including, but not limited to, pain, bleeding, infection, scarring, need for blood transfusion, damage to anal sphincter, incontinence of gas and/or stool, need for additional procedures, recurrence, pneumonia, heart attack, stroke, death) benefits and alternatives to surgery were discussed at length. I noted a good probability that the procedure would help improve their  symptoms. The patient's questions were answered to their satisfaction and they elected to proceed with surgery.  OR FINDINGS: Right anterior external opening x2.  These connected with an internal opening.  Subcutaneous anal fistula did not involve anal sphincter muscle so a primary fistulotomy was performed.  DESCRIPTION: The patient was identified in the preoperative holding area and taken to the OR where they were placed on the operating room table. SCDs were placed. General anesthesia was induced without difficulty. The patient was then positioned in prone jackknife position with buttocks gently taped apart.  The patient was then prepped and draped in usual sterile fashion.  A surgical timeout was performed indicating the correct patient, procedure, and positioning.  A pudendal block was performed using 0.25% marcaine mixed with Exparel.  A digital rectal exam was performed and no palpable abnormalities was identified.  2 side-by-side external openings are noted at the right anterior position.  Hill-Ferguson retractor was placed and there is no identifiable internal opening.  The fistulas were probed using a pliable lacrimal probe and an internal opening was discovered.  The 2 external openings connected in the subcutaneous tissue. They then tracked into the anal canal to a single internal opening.  The total length of the fistulous tract was approximately 2 cm.  There was no overlying sphincter muscle and as such deemed candidate for fistulotomy.  The perianal skin, anoderm and overlying subcutaneous tissue was incised using electrocautery.  The fistulous tract base was then debrided to remove the granulation tissue.  The wound was irrigated and hemostasis verified.  Additional anesthetic was then infiltrated into the area around the fistulous openings.  This wound was left open.  A piece of  Proctofoam was then placed.  4 x 4's and ABD were placed over the anal canal the patient was then transferred to a  stretcher, extubated and transferred to PACU in satisfactory condition.

## 2017-12-08 NOTE — Discharge Instructions (Addendum)
ANORECTAL SURGERY: POST OP INSTRUCTIONS  1. DIET: Follow a light bland diet the first 24 hours after arrival home, such as soup, liquids, crackers, etc.  Be sure to include lots of fluids daily.  Avoid fast food or heavy meals as your are more likely to get nauseated.  Eat a low fat diet the next few days after surgery.    2. Take your usually prescribed home medications unless otherwise directed.  3. PAIN CONTROL: a. It is helpful to take an over-the-counter pain medication regularly for the first few days/weeks.  Choose from the following that works best for you: i. Ibuprofen (Advil, etc) Three 200mg  tabs every 6 hours as needed. ii. Acetaminophen (Tylenol, etc) 500-650mg  every 6 hours as needed iii. NOTE: You may take both of these medications together - most patients find it most helpful when alternating between the two (i.e. Ibuprofen at 6am, tylenol at 9am, ibuprofen at 12pm ...) b. A  prescription for pain medication may have been prescribed for you at discharge.  Take your pain medication as prescribed.  i. If you are having problems/concerns with the prescription medicine, please call us for further advice.  4. Avoid getting constipated.  Between the surgery and the pain medications, it is common to experience some constipation.  Increasing fluid intake (64oz of water per day) and taking a fiber supplement (such as Metamucil, Citrucel, FiberCon) 1-2 times a day regularly will usually help prevent this problem from occurring.  Take Miralax (over the counter) 1-2x/day while taking a narcotic pain medication if you encounter issues with constipation. If no bowel movement after 48hours, you may additionally take a laxative like Milk of Magnesia which can be purchased over the counter. Avoid enemas if possible as these are often painful.   5. Watch out for diarrhea.  If you have many loose bowel movements, simplify your diet to bland foods and continue taking a fiber supplement as this will also  help bind the stool together.  If this worsens or does not improve, please call us.  6. Wash / shower every day.  If you were discharged with a dressing, you may remove this the day after your surgery. You may shower normally, getting soap/water on your wound, particularly after bowel movements.  7. Soaking in a warm bath filled a couple inches ("Sitz bath") is a great way to clean the area after a bowel movement and many patients find it is a way to soothe the area.  8. ACTIVITIES as tolerated:   a. You may resume regular (light) daily activities beginning the next day--such as daily self-care, walking, climbing stairs--gradually increasing activities as tolerated.  If you can walk 30 minutes without difficulty, it is safe to try more intense activity such as jogging, treadmill, bicycling, low-impact aerobics, etc. b. Refrain from any heavy lifting or straining for the first 2 weeks after your procedure, particularly if your surgery was for hemorrhoids. c. Avoid activities that make your pain worse d. You may drive when you are no longer taking prescription pain medication, you can comfortably wear a seatbelt, and you can safely maneuver your car and apply brakes.  9. FOLLOW UP in our office a. Please call [facility] at [phone] to set up an appointment to see your surgeon in the office for a follow-up appointment approximately 2 weeks after your surgery. b. Make sure that you call for this appointment the day you arrive home to insure a convenient appointment time.  9. If you have disability  or family leave forms that need to be completed, you may have them completed by your primary care physician's office; for return to work instructions, please ask our office staff and they will be happy to assist you in obtaining this documentation   When to call us [phone]: 1. Poor pain control 2. Reactions / problems with new medications (rash/itching, etc)  3. Fever over 101.5 F (38.5  C) 4. Inability to urinate 5. Nausea/vomiting 6. Worsening swelling or bruising 7. Continued bleeding from incision. 8. Increased pain, redness, or drainage from the incision  The clinic staff is available to answer your questions during regular business hours (8:30am-5pm).  Please dont hesitate to call and ask to speak to one of our nurses for clinical concerns.   A surgeon from [facility] is always on call at the hospitals   If you have a medical emergency, go to the nearest emergency room or call 911.   [facility] [address], [address], [place], [place]  [address] ? MAIN: [phone] FAX [phone] [email or web address]

## 2017-12-08 NOTE — Anesthesia Postprocedure Evaluation (Signed)
Anesthesia Post Note  Patient: [name]  Procedure(s) Performed: ANAL EXAM UNDER ANESTHESIA, FISTULOTOMY (N/A Rectum)     Patient location during evaluation: PACU Anesthesia Type: General Level of consciousness: awake and alert Pain management: pain level controlled Vital Signs Assessment: post-procedure vital signs reviewed and stable Respiratory status: spontaneous breathing, nonlabored ventilation, respiratory function stable and patient connected to nasal cannula oxygen Cardiovascular status: blood pressure returned to baseline and stable Postop Assessment: no apparent nausea or vomiting Anesthetic complications: no    Last Vitals:  Vitals:   12/08/17 1200 12/08/17 1227  BP: (!) 144/83 (!) 153/87  Pulse: 88 90  Resp:  16  Temp:  (!) 36.3 C  SpO2: 93% 96%    Last Pain:  Vitals:   12/08/17 1227  TempSrc:   PainSc: 3                  Pier Bosher EDWARD

## 2017-12-08 NOTE — Anesthesia Preprocedure Evaluation (Addendum)
Anesthesia Evaluation  Patient identified by MRN, date of birth, ID band Patient awake    Reviewed: Allergy & Precautions, H&P , Patient's Chart, lab work & pertinent test results, reviewed documented beta blocker date and time   Airway Mallampati: II  TM Distance: >3 FB Neck ROM: full    Dental no notable dental hx.    Pulmonary    Pulmonary exam normal breath sounds clear to auscultation       Cardiovascular hypertension,  Rhythm:regular Rate:Normal     Neuro/Psych    GI/Hepatic   Endo/Other  Morbid obesity  Renal/GU      Musculoskeletal   Abdominal   Peds  Hematology   Anesthesia Other Findings   Reproductive/Obstetrics                            Anesthesia Physical Anesthesia Plan  ASA: III  Anesthesia Plan: General   Post-op Pain Management:    Induction: Intravenous  PONV Risk Score and Plan: 2 and Dexamethasone, Ondansetron and Treatment may vary due to age or medical condition  Airway Management Planned: Oral ETT and Video Laryngoscope Planned  Additional Equipment:   Intra-op Plan:   Post-operative Plan: Extubation in OR  Informed Consent: I have reviewed the patients History and Physical, chart, labs and discussed the procedure including the risks, benefits and alternatives for the proposed anesthesia with the patient or authorized representative who has indicated his/her understanding and acceptance.   Dental Advisory Given  Plan Discussed with: CRNA and Surgeon  Anesthesia Plan Comments: (  )       Anesthesia Quick Evaluation

## 2017-12-08 NOTE — Anesthesia Procedure Notes (Signed)
Procedure Name: Intubation Date/Time: 12/08/2017 9:48 AM Performed by: Trygg Mantz, Clinical cytogeneticisteggy D, CRNA Pre-anesthesia Checklist: Patient identified, Emergency Drugs available, Suction available and Patient being monitored Patient Re-evaluated:Patient Re-evaluated prior to induction Oxygen Delivery Method: Circle system utilized Preoxygenation: Pre-oxygenation with 100% oxygen Induction Type: IV induction Ventilation: Mask ventilation without difficulty Laryngoscope Size: Glidescope and 4 Grade View: Grade I Tube type: Oral Tube size: 7.5 mm Number of attempts: 1 Airway Equipment and Method: Stylet Placement Confirmation: ETT inserted through vocal cords under direct vision,  positive ETCO2 and breath sounds checked- equal and bilateral Secured at: 22 cm Tube secured with: Tape Dental Injury: Teeth and Oropharynx as per pre-operative assessment  Difficulty Due To: Difficulty was anticipated, Difficult Airway- due to large tongue, Difficult Airway- due to reduced neck mobility and Difficult Airway- due to limited oral opening

## 2017-12-19 ENCOUNTER — Telehealth: Payer: Self-pay | Admitting: Internal Medicine

## 2017-12-19 NOTE — Telephone Encounter (Signed)
Pt was seen in January and scheduled for flex sig at the hospital next week. Pt had a fistulectomy with [facility] 12/08/17.Pt wants to cancel flex sig, states he does not want to do it. Appt cancelled. Dr. Rhea BeltonPyrtle notifed. There is an opening [facility]rtle[facility]30[facility] if needed.

## 2017-12-27 ENCOUNTER — Ambulatory Visit (HOSPITAL_COMMUNITY): Admission: RE | Admit: 2017-12-27 | Payer: Medicaid Other | Source: Ambulatory Visit | Admitting: Internal Medicine

## 2017-12-27 ENCOUNTER — Encounter (HOSPITAL_COMMUNITY): Admission: RE | Payer: Self-pay | Source: Ambulatory Visit

## 2017-12-27 SURGERY — SIGMOIDOSCOPY, FLEXIBLE
Anesthesia: Monitor Anesthesia Care

## 2018-06-05 ENCOUNTER — Ambulatory Visit: Payer: Medicaid Other | Admitting: Family Medicine

## 2018-06-05 VITALS — BP 145/71 | HR 67 | Temp 98.6°F | Resp 15 | Ht 65.0 in | Wt 331.0 lb

## 2018-06-05 DIAGNOSIS — Z0189 Encounter for other specified special examinations: Principal | ICD-10-CM

## 2018-06-05 DIAGNOSIS — Z008 Encounter for other general examination: Secondary | ICD-10-CM

## 2018-06-05 NOTE — Progress Notes (Signed)
Subjective: Annual biometrics screening  Patient presents for his annual biometric screening. Patient reports eating a healthy, well-rounded diet and getting regular physical activity.  Patient recently made changes to his diet to achieve weight loss and has lost 15 pounds in the last month. PCP: Gerald StabsKaitlyn Watson -although the patient would like to find a new primary care provider that is more convenient to his home.  Patient works for [employer]. Patient denies any other issues or concerns.   Review of Systems Unremarkable  Objective  Physical Exam General: Awake, alert and oriented. No acute distress. Well developed, hydrated and nourished. Appears stated age.  HEENT: Supple neck without adenopathy. Sclera is non-icteric. The ear canal is clear without discharge. The tympanic membrane is normal in appearance with normal landmarks and cone of light. Nasal mucosa is pink and moist. Oral mucosa is pink and moist. The pharynx is normal in appearance without tonsillar swelling or exudates.  Skin: Skin in warm, dry and intact without rashes or lesions. Appropriate color for ethnicity. Cardiac: Heart rate and rhythm are normal. No murmurs, gallops, or rubs are auscultated.  Respiratory: The chest wall is symmetric and without deformity. No signs of respiratory distress. Lung sounds are clear in all lobes bilaterally without rales, ronchi, or wheezes.  Neurological: The patient is awake, alert and oriented to person, place, and time with normal speech.  Memory is normal and thought processes intact. No gait abnormalities are appreciated.  Psychiatric: Appropriate mood and affect.   Assessment Annual biometrics screening  Plan  Lipid panel and blood sugar pending. Encouraged routine visits with primary care provider.  Provided patient with a list of local resources if he would like to change primary care providers. Patient's blood pressure is 145/71 today.  Discussed normal values.  Advised  patient to monitor this regularly and report abnormal values to his primary care provider. Encouraged patient to get regular exercise and eat a healthy, well-rounded diet.

## 2018-06-06 LAB — LIPID PANEL
Chol/HDL Ratio: 4 ratio (ref 0.0–5.0)
Cholesterol, Total: 108 mg/dL (ref 100–199)
HDL: 27 mg/dL — ABNORMAL LOW (ref >39)
LDL Calculated: 64 mg/dL (ref 0–99)
Triglycerides: 85 mg/dL (ref 0–149)
VLDL Cholesterol Cal: 17 mg/dL (ref 5–40)

## 2018-06-06 LAB — GLUCOSE, RANDOM: Glucose: 102 mg/dL — ABNORMAL HIGH (ref 65–99)

## 2018-06-06 NOTE — Progress Notes (Signed)
Duane HerterShannon, Will you call the patient and inform them that their lipid panel and fasting blood sugar came back?  Everything is normal, with the exception of his HDL cholesterol and fasting blood sugar. The HDL cholesterol ("good cholesterol") is decreased at 27, normal values are above 39.  His fasting blood sugar is slightly elevated at 102, normal values are between 65 and 99.  This may represent prediabetes but further evaluation is necessary. Please advise the patient to follow-up with their primary care provider regarding these results.

## 2018-06-07 ENCOUNTER — Ambulatory Visit: Payer: Self-pay

## 2018-06-12 ENCOUNTER — Ambulatory Visit: Payer: Self-pay | Admitting: Family Medicine

## 2018-06-12 VITALS — BP 153/81 | HR 68 | Temp 98.6°F | Resp 16

## 2018-06-12 DIAGNOSIS — R1031 Right lower quadrant pain: Secondary | ICD-10-CM

## 2018-06-12 DIAGNOSIS — N5082 Scrotal pain: Secondary | ICD-10-CM

## 2018-06-12 DIAGNOSIS — K6289 Other specified diseases of anus and rectum: Secondary | ICD-10-CM

## 2018-06-12 DIAGNOSIS — K625 Hemorrhage of anus and rectum: Secondary | ICD-10-CM

## 2018-06-12 DIAGNOSIS — R197 Diarrhea, unspecified: Secondary | ICD-10-CM

## 2018-06-12 NOTE — Progress Notes (Signed)
Subjective: abdominal pain      [name] is a 47 y.o. male who presents for evaluation of RLQ abdominal pain. Onset was approximately 1-2 weeks ago, patient unsure. Symptoms have been unchanged. The pain is described as  5/10 in intensity. Pain is located in the RLQ with radiation to scrotum.  Patient reports sudden onset pain while lifting something heavy.  Denies any history of hernias in the past.  Aggravating factors: none.  Alleviating factors: none. Associated symptoms: diarrhea, fatigue, and rectal bleeding for 3 days.  Patient has noted rectal bleeding with wiping only and describes it as "bright red spotting".  Reports mild intermittent rectal pressure with bowel movements.  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  Patient never attended postop follow-up appointment.  Patient reports decreased caloric intake in the last month in an attempt to achieve healthy weight loss.  Otherwise denies any changes in diet.  Describes bowel movements as very loose.  Denies recent antibiotic use, recent travel, or sick contacts.  Patient also reports edema and pain to the right inferior portion of his scrotum x 2-3 weeks.  Patient also believes he has palpated "nodules" in his scrotum for the last year that are tender.  Patient is concerned due to his significant family history of testicular cancer.  Patient's father was diagnosed at the age of 48.  Patient's father, paternal uncle, and paternal grandfather were all diagnosed with testicular cancer.  The patient denies anorexia, arthralagias, unintended weight loss, belching, chills, constipation, dysuria, fever, malaise, flatus, frequency, headache, hematochezia, hematuria, melena, myalgias, nausea, sweats, or vomiting.  Patient reports being under the care of dermatology for brown macules to bilateral palms, penis, and hyperpigmentation to his forehead.  Denies any changes in these lesions.   Patient denies any other relevant history.  Patient denies any history of cancer.  Treatment attempted at home: Motrin as needed.  Review of Systems Pertinent items noted in HPI and remainder of comprehensive ROS otherwise negative.     Objective:   General appearance: alert, cooperative, appears stated age and no distress.  Obese. Abdomen:  -Normal findings: bowel sounds normal, no masses palpable, no organomegaly, no hernia, no scars, striae, dilated veins, rashes, or lesions and symmetric.  No guarding, negative for rebound tenderness, negative Rovsing sign.  No distention. -Abnormal findings:  obese and moderate tenderness in the RLQ. Male genitalia:  -Normal findings: no urethral discharge, scrotal contents normal to inspection and palpation, normal testes palpated bilaterally and no hernia detected.  -Abnormal findings: Generalized scrotal tenderness upon palpation, especially to posterior/inferior aspect.  Small, round, flat macule to the glans penis. Rectal: normal tone, no masses or localized tenderness.  Unable to palpate any internal/external hemorrhoids, abscess, or fistula, although exam limited due to patient reporting extreme discomfort on exam and being unable to tolerate digital rectal exam. Scar tissue noted to external anus.  No bleeding noted.  Pulses: 2+ and symmetric Skin: Skin color, texture, turgor normal. Large patch of hyperpigmentation noted to forehead.  Multiple small, circular, discrete brown macules to hands bilaterally.  Patient reports all lesions are unchanged since he saw dermatology.  Otherwise no rashes or lesions.  Lymph nodes: No lymphadenopathy present. Neurologic: Grossly normal Chaperone present during physical exam.  Diagnostic Results: none patient referred to emergency department for further evaluation and treatment  Assessment:   Right lower quadrant abdominal pain Diarrhea Scrotal pain Rectal bleeding  Plan:  Advised patient to report to  the emergency department immediately for further evaluation.  Patient refused going to the emergency department immediately and reports that he will present to the emergency department closer to his house later this evening.  Stressed the importance of doing this and informed him of the risks of not going to the emergency department and delaying going to the emergency department for further evaluation and treatment.  Discussion witnessed by Carollee HerterShannon, CMA - patient signed a form stating that he is refusing my advice to be seen at the emergency department immediately. Patient's blood pressure is 153/81 today.  Discussed normal values.  Advised patient to monitor this regularly and report abnormal values to his primary care provider.

## 2018-09-21 ENCOUNTER — Ambulatory Visit: Payer: Medicaid Other

## 2018-09-24 ENCOUNTER — Ambulatory Visit: Payer: Medicaid Other | Admitting: Emergency Medicine

## 2018-10-10 ENCOUNTER — Other Ambulatory Visit: Payer: Self-pay

## 2018-10-10 ENCOUNTER — Emergency Department (HOSPITAL_COMMUNITY): Payer: Managed Care, Other (non HMO)

## 2018-10-10 ENCOUNTER — Emergency Department (HOSPITAL_COMMUNITY)
Admission: EM | Admit: 2018-10-10 | Discharge: 2018-10-10 | Disposition: A | Discharge status: Home / Self Care | Payer: Managed Care, Other (non HMO) | Attending: Emergency Medicine | Admitting: Emergency Medicine

## 2018-10-10 DIAGNOSIS — Z79899 Other long term (current) drug therapy: Secondary | ICD-10-CM | POA: Diagnosis not present

## 2018-10-10 DIAGNOSIS — R079 Chest pain, unspecified: Secondary | ICD-10-CM

## 2018-10-10 DIAGNOSIS — I10 Essential (primary) hypertension: Secondary | ICD-10-CM | POA: Diagnosis not present

## 2018-10-10 DIAGNOSIS — Z7982 Long term (current) use of aspirin: Secondary | ICD-10-CM | POA: Insufficient documentation

## 2018-10-10 LAB — COMPREHENSIVE METABOLIC PANEL
ALT: 21 U/L (ref 0–44)
AST: 18 U/L (ref 15–41)
Albumin: 4 g/dL (ref 3.5–5.0)
Alkaline Phosphatase: 85 U/L (ref 38–126)
Anion gap: 12 (ref 5–15)
BUN: 12 mg/dL (ref 6–20)
CO2: 28 mmol/L (ref 22–32)
Calcium: 9.6 mg/dL (ref 8.9–10.3)
Chloride: 100 mmol/L (ref 98–111)
Creatinine, Ser: 1.13 mg/dL (ref 0.61–1.24)
GFR calc Af Amer: >60 mL/min (ref >60)
GFR calc non Af Amer: >60 mL/min (ref >60)
Glucose, Bld: 99 mg/dL (ref 70–99)
Potassium: 4 mmol/L (ref 3.5–5.1)
Sodium: 140 mmol/L (ref 135–145)
Total Bilirubin: 1 mg/dL (ref 0.3–1.2)
Total Protein: 8.3 g/dL — ABNORMAL HIGH (ref 6.5–8.1)

## 2018-10-10 LAB — CBC
HCT: 45.4 % (ref 39.0–52.0)
Hemoglobin: 14 g/dL (ref 13.0–17.0)
MCH: 25.7 pg — ABNORMAL LOW (ref 26.0–34.0)
MCHC: 30.8 g/dL (ref 30.0–36.0)
MCV: 83.3 fL (ref 80.0–100.0)
Platelets: 397 10*3/uL (ref 150–400)
RBC: 5.45 MIL/uL (ref 4.22–5.81)
RDW: 15.8 % — ABNORMAL HIGH (ref 11.5–15.5)
WBC: 8.7 10*3/uL (ref 4.0–10.5)
nRBC: 0 % (ref 0.0–0.2)

## 2018-10-10 LAB — I-STAT TROPONIN, ED
Troponin i, poc: 0 ng/mL (ref 0.00–0.08)
Troponin i, poc: 0 ng/mL (ref 0.00–0.08)

## 2018-10-10 MED ORDER — IOPAMIDOL (ISOVUE-370) INJECTION 76%
100.0000 mL | Freq: Once | INTRAVENOUS | Status: AC | PRN
  Administered 2018-10-10: 100 mL via INTRAVENOUS

## 2018-10-10 MED ORDER — NITROGLYCERIN 0.4 MG SL SUBL
0.4000 mg | SUBLINGUAL_TABLET | SUBLINGUAL | Status: DC | PRN
  Administered 2018-10-10 (×2): 0.4 mg via SUBLINGUAL
  Filled 2018-10-10: qty 1

## 2018-10-10 MED ORDER — IOPAMIDOL (ISOVUE-370) INJECTION 76%
INTRAVENOUS | Status: AC
  Filled 2018-10-10: qty 100

## 2018-10-10 MED ORDER — ASPIRIN 81 MG PO CHEW
324.0000 mg | CHEWABLE_TABLET | Freq: Once | ORAL | Status: AC
  Administered 2018-10-10: 324 mg via ORAL
  Filled 2018-10-10: qty 4

## 2018-10-10 NOTE — ED Notes (Signed)
Patient verbalizes understanding of discharge instructions. Opportunity for questioning and answers were provided. Armband removed by staff, pt discharged from ED ambulatory.   

## 2018-10-10 NOTE — ED Provider Notes (Signed)
[facility] EMERGENCY DEPARTMENT Provider Note   CSN: [id number] Arrival date & time: 10/10/18  1812     History   Chief Complaint Chief Complaint  Patient presents with  . Chest Pain    HPI [name] is a 47 y.o. male.  The history is provided by the patient.  Chest Pain   This is a new problem. The current episode started less than 1 hour ago. The problem occurs constantly. The problem has been rapidly improving. The pain is associated with rest. The pain is present in the substernal region. The pain is at a severity of 5/10. The pain is moderate. The quality of the pain is described as sharp and pressure-like. The pain radiates to the left arm. Pertinent negatives include no abdominal pain, no back pain, no cough, no diaphoresis, no fever, no headaches, no leg pain, no lower extremity edema, no malaise/fatigue, no nausea, no orthopnea, no palpitations, no shortness of breath, no vomiting and no weakness. He has tried rest for the symptoms. The treatment provided mild relief. Risk factors include male gender and obesity.  His past medical history is significant for hyperlipidemia and hypertension.  Pertinent negatives for past medical history include no CAD, no PE and no seizures.  His family medical history is significant for CAD.  Procedure history is negative for cardiac catheterization.    Past Medical History:  Diagnosis Date  . Anginal pain (HCC)    04/2011 pt had agina, seen by cardiologist, no further treatment  . Arthritis   . Complication of anesthesia    " hard time due to sleep apnea - wtih colonoscopy in 2018   . GERD (gastroesophageal reflux disease)   . High cholesterol   . Hypertension   . Pneumonia    hx of   . Pre-diabetes   . Sleep apnea    cpap- does not know settings   . Swelling    in lower extremities     There are no active problems to display for this patient.   Past Surgical History:  Procedure Laterality Date    . COLONOSCOPY  2018  . left carpal tunnel release           Home Medications    Prior to Admission medications   Medication Sig Start Date End Date Taking? Authorizing Provider  amLODipine (NORVASC) 5 MG tablet Take 5 mg by mouth daily. 05/31/17  Yes [provider]  aspirin EC 81 MG tablet Take 81 mg by mouth every morning.   Yes [provider]  atorvastatin (LIPITOR) 40 MG tablet Take 40 mg by mouth daily.  02/14/17  Yes [provider]  hydrochlorothiazide (HYDRODIURIL) 25 MG tablet Take 25 mg by mouth daily.  02/14/17  Yes [provider]  omeprazole (PRILOSEC) 20 MG capsule Take 20 mg by mouth daily.   Yes [provider]    Family History Family History  Problem Relation Age of Onset  . Diabetes Mother   . Hypertension Mother   . Hypertension Father   . Diabetes Father     Social History Social History   Tobacco Use  . Smoking status: Never Smoker  . Smokeless tobacco: Never Used  Substance Use Topics  . Alcohol use: No    Comment: 1-2 x per month   . Drug use: No     Allergies   Reglan [metoclopramide]   Review of Systems Review of Systems  Constitutional: Negative for chills, diaphoresis, fever and  malaise/fatigue.  HENT: Negative for ear pain and sore throat.   Eyes: Negative for pain and visual disturbance.  Respiratory: Negative for cough and shortness of breath.   Cardiovascular: Positive for chest pain. Negative for palpitations and orthopnea.  Gastrointestinal: Negative for abdominal pain, nausea and vomiting.  Genitourinary: Negative for dysuria and hematuria.  Musculoskeletal: Negative for arthralgias and back pain.  Skin: Negative for color change and rash.  Neurological: Negative for seizures, syncope, weakness and headaches.  All other systems reviewed and are negative.    Physical Exam Updated Vital Signs  ED Triage Vitals  Enc Vitals Group     BP 10/10/18 1828 (!) 151/100     Pulse Rate  10/10/18 1828 67     Resp 10/10/18 1828 18     Temp 10/10/18 1828 98.6 F (37 C)     Temp Source 10/10/18 1828 Oral     SpO2 10/10/18 1828 98 %     Weight 10/10/18 1829 300 lb (136.1 kg)     Height 10/10/18 1829 5\' 6"  (1.676 m)     Head Circumference --      Peak Flow --      Pain Score 10/10/18 1826 6     Pain Loc --      Pain Edu? --      Excl. in GC? --     Physical Exam  Constitutional: He is oriented to person, place, and time. He appears well-developed and well-nourished.  HENT:  Head: Normocephalic and atraumatic.  Eyes: Pupils are equal, round, and reactive to light. Conjunctivae are normal.  Neck: Normal range of motion. Neck supple.  Cardiovascular: Normal rate, regular rhythm, intact distal pulses and normal pulses.  No murmur heard. Pulmonary/Chest: Effort normal and breath sounds normal. No respiratory distress. He has no decreased breath sounds. He has no wheezes.  Abdominal: Soft. There is no tenderness.  Musculoskeletal: He exhibits no edema.       Right lower leg: He exhibits no edema.       Left lower leg: He exhibits no edema.  Neurological: He is alert and oriented to person, place, and time.  Skin: Skin is warm and dry. Capillary refill takes less than 2 seconds.  Psychiatric: He has a normal mood and affect.  Nursing note and vitals reviewed.    ED Treatments / Results  Labs (all labs ordered are listed, but only abnormal results are displayed) Labs Reviewed  CBC - Abnormal; Notable for the following components:      Result Value   MCH 25.7 (*)    RDW 15.8 (*)    All other components within normal limits  COMPREHENSIVE METABOLIC PANEL - Abnormal; Notable for the following components:   Total Protein 8.3 (*)    All other components within normal limits  I-STAT TROPONIN, ED  I-STAT TROPONIN, ED    EKG EKG Interpretation  Date/Time:  Wednesday October 10 2018 18:24:28 EST Ventricular Rate:  73 PR Interval:    QRS Duration: 106 QT  Interval:  402 QTC Calculation: 443 R Axis:   11 Text Interpretation:  Sinus arrhythmia Borderline T wave abnormalities Confirmed by [name] [phone]) on 10/10/2018 6:28:29 PM   Radiology Ct Angio Chest Pe W And/or Wo Contrast  Result Date: 10/10/2018 CLINICAL DATA:  Shortness of breath and chest pain. EXAM: CT ANGIOGRAPHY CHEST WITH CONTRAST TECHNIQUE: Multidetector CT imaging of the chest was performed using the standard protocol during bolus administration of intravenous contrast. Multiplanar CT image reconstructions  and MIPs were obtained to evaluate the vascular anatomy. CONTRAST:  <See Chart> ISOVUE-370 IOPAMIDOL (ISOVUE-370) INJECTION 76% COMPARISON:  None FINDINGS: Cardiovascular: Heart size is normal. No pericardial effusion. Aortic atherosclerosis identified. Preferential opacification of the main pulmonary artery and its branches identified. No central obstructing pulmonary embolus identified. There is no lobar or segmental pulmonary artery filling defects identified to suggest a clinically significant acute pulmonary embolus. Mediastinum/Nodes: No enlarged mediastinal, hilar, or axillary lymph nodes. Thyroid gland, trachea, and esophagus demonstrate no significant findings. Lungs/Pleura: There is no pleural effusion, airspace consolidation, or atelectasis identified. Upper Abdomen: No acute abnormality. Musculoskeletal: No chest wall abnormality. No acute or significant osseous findings. Review of the MIP images confirms the above findings. IMPRESSION: 1. No evidence for acute pulmonary embolus. No acute cardiopulmonary abnormalities identified. 2.  Aortic Atherosclerosis (ICD10-I70.0). Electronically Signed   By: Signa Kellaylor  Stroud M.D.   On: 10/10/2018 20:35   Dg Chest Portable 1 View  Result Date: 10/10/2018 CLINICAL DATA:  Left-sided chest pain and SOB. EXAM: PORTABLE CHEST 1 VIEW COMPARISON:  09/26/2017 FINDINGS: Heart size appears within normal limits. No pleural effusion or edema.  No airspace opacities identified. IMPRESSION: 1. No acute cardiopulmonary abnormalities. Electronically Signed   By: Signa Kellaylor  Stroud M.D.   On: 10/10/2018 19:50    Procedures Procedures (including critical care time)  Medications Ordered in ED Medications  nitroGLYCERIN (NITROSTAT) SL tablet 0.4 mg (0.4 mg Sublingual Given 10/10/18 2115)  aspirin chewable tablet 324 mg (324 mg Oral Given 10/10/18 1848)  iopamidol (ISOVUE-370) 76 % injection 100 mL (100 mLs Intravenous Contrast Given 10/10/18 2024)     Initial Impression / Assessment and Plan / ED Course  I have reviewed the triage vital signs and the nursing notes.  Pertinent labs & imaging results that were available during my care of the patient were reviewed by me and considered in my medical decision making (see chart for details).     Marlan PalauRonald M Hays is a 47 year old male with history of hypertension, high cholesterol, prediabetes who presents to the ED with chest pain.  Patient with normal vitals.  No fever.  Patient with substernal chest pain that radiated to his left arm with diaphoresis just prior to arrival that has improved at rest.  Patient has EKG that shows sinus rhythm.  No signs of ischemic changes.  Patient has no DVT or PE risk factors but was mildly tachycardic upon arrival to the ED. Overall unremarkable exam.  Chest pain is not reproducible.  Has clear breath sounds bilaterally.  Patient had chest x-ray that showed no signs of pneumonia, pneumothorax, pleural effusion.  Patient with no significant anemia, electrolyte abnormality, kidney injury.  No leukocytosis.  CT chest was ordered that showed no acute PE.  Troponins were negative x2.  Patient does have high heart score but refuses admission for further ACS rule out.  Patient has capacity to make this decision.  Patient decided to leave AGAINST MEDICAL ADVICE after further discussion about risks and benefits.  Patient given information to follow-up with cardiology.   Discharged from ED in good condition and given return precautions.  This chart was dictated using voice recognition software.  Despite best efforts to proofread,  errors can occur which can change the documentation meaning.   Final Clinical Impressions(s) / ED Diagnoses   Final diagnoses:  Chest pain, unspecified type    ED Discharge Orders    None       Virgina NorfolkCuratolo, Sruti Ayllon, DO 10/10/18 2208

## 2018-10-10 NOTE — ED Notes (Signed)
Patient transported to CT 

## 2018-10-10 NOTE — ED Triage Notes (Signed)
Pt presents to ED with sharp central/left cp radiating down L arm, diaphoresis that began at rest. NAD.

## 2018-10-19 ENCOUNTER — Ambulatory Visit: Payer: Self-pay | Admitting: Family

## 2018-11-02 NOTE — Progress Notes (Signed)
Cardiology Office Note   Date:  11/05/2018   ID:  [name], DOB Jan 09, 1971, MRN [id number]  PCP:  System, Pcp Not In  Cardiologist:   Gabriell Daigneault [place], MD   Chief Complaint  Patient presents with  . Chest Pain      History of Present Illness: [name] is a 48 y.o. male who presents for is seen at the request of Duane Norfolk DO for evaluation of chest pain. He was seen 10/10/18 in the ED with chest pain. Troponin levels were normal. CT chest showed no evidence of PE. There was aortic calcification. No coronary calcification. Ecg showed nonspecific T wave abnormality.   On follow up today he is doing well. He states he was having pain in his back that radiated to his chest with some pressure. This has resolved. He has lost 50 lbs since June. Doesn't always wear his CPAP. BP generally is well controlled but does go up when he is anxious or upset. He walk a lot at work. 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 Prior primary care was in [facility]. Now working in [employer] and needs to get new primary care.     Past Medical History:  Diagnosis Date  . Anginal pain (HCC)    04/2011 pt had agina, seen by cardiologist, no further treatment  . Arthritis   . Complication of anesthesia    " hard time due to sleep apnea - wtih colonoscopy in 2018   . GERD (gastroesophageal reflux disease)   . High cholesterol   . Hypertension   . Pneumonia    hx of   . Pre-diabetes   . Sleep apnea    cpap- does not know settings   . Swelling    in lower extremities     Past Surgical History:  Procedure Laterality Date  . COLONOSCOPY  2018  . left carpal tunnel release        Current Outpatient Medications  Medication Sig Dispense Refill  . amLODipine (NORVASC) 5 MG tablet Take 5 mg by mouth daily.  3  . aspirin EC 81 MG tablet Take 81 mg by mouth every morning.    Marland Kitchen atorvastatin (LIPITOR) 40 MG tablet Take 40 mg by mouth daily.   3  . hydrochlorothiazide (HYDRODIURIL) 25 MG  tablet Take 25 mg by mouth daily.   2  . omeprazole (PRILOSEC) 20 MG capsule Take 20 mg by mouth daily.     No current facility-administered medications for this visit.     Allergies:   Reglan [metoclopramide]    Social History:  The patient  reports that he has never smoked. He has never used smokeless tobacco. He reports that he does not drink alcohol or use drugs.   Family History:  The patient's family history includes Diabetes in his father and mother; Hypertension in his father and mother; Lung cancer in his father; Stroke in his mother.    ROS:  Please see the history of present illness.   Otherwise, review of systems are positive for none.   All other systems are reviewed and negative.    PHYSICAL EXAM: VS:  BP 124/78   Pulse 62   Ht 5\' 7"  (1.702 m)   Wt (!) 307 lb 3.2 oz (139.3 kg)   BMI 48.11 kg/m  , BMI Body mass index is 48.11 kg/m. GEN: Well nourished, obese BM in no acute distress  HEENT: normal  Neck: no JVD, carotid bruits, or masses Cardiac:  RRR; no murmurs, rubs, or gallops,no edema  Respiratory:  clear to auscultation bilaterally, normal work of breathing GI: soft, nontender, nondistended, + BS MS: no deformity or atrophy  Skin: warm and dry, no rash Neuro:  Strength and sensation are intact Psych: euthymic mood, full affect   EKG:  EKG is ordered today. The ekg ordered today demonstrates NSR with nonspecific TWA. I have personally reviewed and interpreted this study.    Recent Labs: 10/10/2018: ALT 21; BUN 12; Creatinine, Ser 1.13; Hemoglobin 14.0; Platelets 397; Potassium 4.0; Sodium 140    Lipid Panel    Component Value Date/Time   CHOL 108 06/05/2018 0812   TRIG 85 06/05/2018 0812   HDL 27 (L) 06/05/2018 0812   CHOLHDL 4.0 06/05/2018 0812   LDLCALC 64 06/05/2018 0812      Wt Readings from Last 3 Encounters:  11/05/18 (!) 307 lb 3.2 oz (139.3 kg)  10/10/18 300 lb (136.1 kg)  06/05/18 (!) 331 lb (150.1 kg)      Other studies  Reviewed: Additional studies/ records that were reviewed today include: reviewed personally CT scan.    ASSESSMENT AND PLAN:  1.  Atypical chest pain. Risk factors of obesity, HTN, HLD. CT scan reviewed and there is NO coronary calcification and visualized portions of the coronary tree look normal. I am underwhelmed by any aortic atherosclerosis. I would recommend continued risk factor modification. I don't feel any additional cardiac evaluation is warranted 2. HTN well controlled 3. HLD LDL is at goal. 4. Obesity with OSA. Continue CPAP nightly. Continue to focus on weight loss and increase aerobic activity.  I have encouraged him to get established with primary care. I will see back as needed.   Current medicines are reviewed at length with the patient today.  The patient does not have concerns regarding medicines.  The following changes have been made:  no change  Labs/ tests ordered today include:  No orders of the defined types were placed in this encounter.    Disposition:   FU PRN   Signed, Dabney Dever [place], MD  11/05/2018 3:19 PM    [facility] [address], [place], [place], [address] Phone [phone], Fax [phone]

## 2018-11-05 ENCOUNTER — Encounter: Payer: Self-pay | Admitting: Cardiology

## 2018-11-05 ENCOUNTER — Ambulatory Visit (INDEPENDENT_AMBULATORY_CARE_PROVIDER_SITE_OTHER): Payer: Managed Care, Other (non HMO) | Admitting: Cardiology

## 2018-11-05 VITALS — BP 124/78 | HR 62 | Ht 67.0 in | Wt 307.2 lb

## 2018-11-05 DIAGNOSIS — G4733 Obstructive sleep apnea (adult) (pediatric): Secondary | ICD-10-CM

## 2018-11-05 DIAGNOSIS — E7800 Pure hypercholesterolemia, unspecified: Secondary | ICD-10-CM | POA: Diagnosis not present

## 2018-11-05 DIAGNOSIS — R079 Chest pain, unspecified: Secondary | ICD-10-CM | POA: Diagnosis not present

## 2018-11-05 DIAGNOSIS — I10 Essential (primary) hypertension: Secondary | ICD-10-CM | POA: Diagnosis not present

## 2018-12-20 ENCOUNTER — Ambulatory Visit: Payer: Self-pay | Admitting: Adult Health

## 2018-12-20 ENCOUNTER — Other Ambulatory Visit: Payer: Self-pay

## 2018-12-20 ENCOUNTER — Encounter: Payer: Self-pay | Admitting: Adult Health

## 2018-12-20 ENCOUNTER — Emergency Department: Payer: Managed Care, Other (non HMO)

## 2018-12-20 ENCOUNTER — Encounter: Payer: Self-pay | Admitting: Emergency Medicine

## 2018-12-20 ENCOUNTER — Emergency Department
Admission: EM | Admit: 2018-12-20 | Discharge: 2018-12-20 | Discharge status: Against Medical Advice | Payer: Managed Care, Other (non HMO) | Attending: Emergency Medicine | Admitting: Emergency Medicine

## 2018-12-20 VITALS — BP 146/72 | HR 60 | Temp 97.7°F | Resp 16

## 2018-12-20 DIAGNOSIS — R079 Chest pain, unspecified: Secondary | ICD-10-CM

## 2018-12-20 DIAGNOSIS — Z5321 Procedure and treatment not carried out due to patient leaving prior to being seen by health care provider: Secondary | ICD-10-CM | POA: Diagnosis not present

## 2018-12-20 LAB — BASIC METABOLIC PANEL
Anion gap: 8 (ref 5–15)
BUN: 12 mg/dL (ref 6–20)
CO2: 25 mmol/L (ref 22–32)
Calcium: 8.8 mg/dL — ABNORMAL LOW (ref 8.9–10.3)
Chloride: 105 mmol/L (ref 98–111)
Creatinine, Ser: 0.9 mg/dL (ref 0.61–1.24)
GFR calc Af Amer: >60 mL/min (ref >60)
GFR calc non Af Amer: >60 mL/min (ref >60)
Glucose, Bld: 103 mg/dL — ABNORMAL HIGH (ref 70–99)
Potassium: 3.7 mmol/L (ref 3.5–5.1)
Sodium: 138 mmol/L (ref 135–145)

## 2018-12-20 LAB — CBC
HCT: 43.2 % (ref 39.0–52.0)
Hemoglobin: 14.1 g/dL (ref 13.0–17.0)
MCH: 27.4 pg (ref 26.0–34.0)
MCHC: 32.6 g/dL (ref 30.0–36.0)
MCV: 83.9 fL (ref 80.0–100.0)
Platelets: 338 10*3/uL (ref 150–400)
RBC: 5.15 MIL/uL (ref 4.22–5.81)
RDW: 15 % (ref 11.5–15.5)
WBC: 7.1 10*3/uL (ref 4.0–10.5)
nRBC: 0 % (ref 0.0–0.2)

## 2018-12-20 LAB — TROPONIN I: Troponin I: <0.03 ng/mL (ref <0.03)

## 2018-12-20 MED ORDER — SODIUM CHLORIDE 0.9% FLUSH: 3.0000 mL | Freq: Once | INTRAVENOUS | Status: DC

## 2018-12-20 NOTE — ED Notes (Signed)
Called for room x 1.  

## 2018-12-20 NOTE — ED Notes (Signed)
Called for room x2 with no response. 

## 2018-12-20 NOTE — ED Triage Notes (Signed)
Pt states left sided cp radiating to left arm began yesterday was recently told he has plaque in his heart, no distress noted, states just sitting in triage he is mildly shob. Denies cough.

## 2018-12-20 NOTE — Progress Notes (Addendum)
[facility]  Subjective:     Patient ID: [name], male   DOB: 10/22/1971, 48 y.o.   MRN: [id number]  HPI   Blood pressure (!) 146/72, pulse 60, temperature 97.7 F (36.5 C), temperature source Oral, resp. rate 16, SpO2 98 %. Patient is a 48 year old male in no acute distress who comes to the clinic for complaints of 10/10 chest pain left sided  Radiating to left side of neck, bilateral arm pain -  he reports that started this morning around 8 a m  He denies any relief since and feels lightheaded and dizzy.  He reports shortness of breath with exertion.  Mild right upper abdominal pain he reports.  Denies cough.   Denies vomiting or diaphoresis.    Review of Systems  Constitutional: Positive for fatigue. Negative for activity change, appetite change, chills, diaphoresis, fever and unexpected weight change.  HENT: Negative.   Respiratory: Positive for chest tightness and shortness of breath.   Cardiovascular: Positive for chest pain. Negative for palpitations and leg swelling.  Gastrointestinal: Positive for abdominal pain (right sided ) and nausea. Negative for abdominal distention, anal bleeding, blood in stool, constipation, diarrhea, rectal pain and vomiting.  Genitourinary: Negative.   Musculoskeletal: Negative.   Skin: Negative.   Neurological: Positive for dizziness and light-headedness. Negative for seizures, syncope, facial asymmetry, speech difficulty, numbness and headaches.  Hematological: Negative.   Psychiatric/Behavioral: Negative.    He dose have intermittent shortness of breath with speech.     Objective:   Physical Exam Constitutional:      General: He is not in acute distress.    Appearance: He is well-developed. He is obese. He is not ill-appearing, toxic-appearing or diaphoretic.  HENT:     Head: Normocephalic and atraumatic.  Eyes:     Extraocular Movements: Extraocular movements intact.  Neck:   Musculoskeletal: Normal range of motion and neck supple.  Cardiovascular:     Heart sounds: Normal heart sounds. No murmur. No friction rub. No gallop.   Pulmonary:     Effort: Pulmonary effort is normal. No respiratory distress.     Breath sounds: Normal breath sounds. No stridor. No wheezing, rhonchi or rales.  Chest:     Chest wall: No tenderness.  Abdominal:     Comments: patient deferred due to dizziness with position change   Skin:    General: Skin is warm.  Neurological:     Mental Status: He is alert and oriented to person, place, and time.     GCS: GCS eye subscore is 4. GCS verbal subscore is 5. GCS motor subscore is 6.  Psychiatric:        Mood and Affect: Mood normal. Mood is not anxious.        Speech: Speech normal.        Behavior: Behavior normal.        Assessment:     Chest pain, rule out acute myocardial infarction   Plan:        Provider advised 911 call NOW or evaluation of chest pain in the emergency room given patient's most recent history and presenting symptoms to the clinic.  No stat labs are available in this clinic.  Patient needs higher level of care.  Unable to rule out active MI in this clinic.  Patient declined EMS transport to the hospital and signed AMA form that he would drive himself over risk versus benefits explained and patient verbalized understanding. Also  offered courtesy car.  Dollene Primrose witnessed CMA / signed AMA form with patient/ and provider.  CC note to cardiologist

## 2018-12-21 ENCOUNTER — Telehealth: Payer: Self-pay | Admitting: Adult Health

## 2018-12-21 ENCOUNTER — Telehealth: Payer: Self-pay | Admitting: Cardiology

## 2018-12-21 DIAGNOSIS — M722 Plantar fascial fibromatosis: Secondary | ICD-10-CM | POA: Insufficient documentation

## 2018-12-21 DIAGNOSIS — J302 Other seasonal allergic rhinitis: Secondary | ICD-10-CM | POA: Insufficient documentation

## 2018-12-21 NOTE — Telephone Encounter (Signed)
  Patient went to the ED on 12/20/18 and he would like someone to look at his xrays and let him know if he needs to be seen in office. He is a patient of Dr Swaziland.

## 2018-12-21 NOTE — Telephone Encounter (Signed)
His CXR was OK. If he is continuing to have chest pain he can schedule an appointment to reevaluate.  Daishia Fetterly [place] MD, [facility]

## 2018-12-21 NOTE — Telephone Encounter (Signed)
Called patient, he states that the ED appointment was taking too long and he left, and now would like to know if he should come in to be seen by his XRAY. I advised with patient that Dr.Jordan was out of office, and would not return until next week.  Patient will wait for Dr.Jordan to come back if he should be seen. No other questions or concerns.

## 2018-12-21 NOTE — Telephone Encounter (Signed)
Patient calling on 12/21/2018, he is requesting a nebulizer machine for home.  He denies any previous history of asthma or previous nebulizer use.  He denies ever using an inhaler.  He was seen on 12/20/2018 for chest pain with radiating pain to arms and neck as well shortness of breath.  He was sent from clinic to emergency department and he verbalizes that he left the emergency department department AMA because they were taking too long.  Patient was going to be admitted to the hospital on 12/21/18.  He needs higher level of care than this clinic can provide.  Provider advised patient that he needs to return to the emergency room and not to self drive immediately.  He is to call 911 for any emergent symptoms.  Patient verbalized that understanding after risk and benefits were explained and risk are not limited to death if proper treatment is not sought.  Patient verbalized understanding and reports he will go to the emergency room.  He is also given information on finding a primary care provider, he has seen a primary care provider in May 2019.  He reports he is seeking a new primary care provider and that he lives in [place].  Information of practices taking new patients was provided to patient for a later date after he has been released from the emergency department or hospital.  Advised patient call the office or your primary care doctor for an appointment if no improvement within 72 hours or if any symptoms change or worsen at any time  Advised ER or urgent Care if after hours or on weekend. Call 911 for emergency symptoms at any time.Patinet verbalized understanding of all instructions given/reviewed and treatment plan and has no further questions or concerns at this time.

## 2018-12-24 NOTE — Telephone Encounter (Signed)
Called patient no answer.LMTC. 

## 2018-12-25 NOTE — Telephone Encounter (Signed)
Received call back from patient Dr.Jordan advised cxr was ok.Stated he is continuing to have chest pain off and on.Stated he needs appointment early in am or late pm.Appointment scheduled with Joni Reining DNP 01/08/19 at 8:30 am.Advised to go to ED if needed.

## 2019-01-04 NOTE — Progress Notes (Signed)
Cardiology Office Note   Date:  01/08/2019   ID:  [name], DOB 08-15-1971, MRN [id number]  PCP:  System, Pcp Not In  Cardiologist:  Dr. Swaziland  Chief Complaint  Patient presents with  . Chest Pain  . Hypertension     History of Present Illness: [name] is a 48 y.o. male who presents for ongoing assessment and management of chest pain, which was reported as atypical. CT scan in 09/2018  found no coronary calcification. Other history includes HTN, HL, GERD, obesity and OSA   He was seen in the ED in 12/2018 for complaints of chest pain but left due to the length of the wait to be seen. CXR was completed and found to be normal He requested an appointment to be seen.   He describes "pinching pain" in his shoulders and right pectoral area of his chest. He admits to non-compliance with CPAP and worsening dyspnea when he does not use it. The mask is ill-fitting per patient.  He had been seen by sleep physician for management in many years. He states that neurologist began him on CPAP but he has not seen him.   Past Medical History:  Diagnosis Date  . Anginal pain (HCC)    04/2011 pt had agina, seen by cardiologist, no further treatment  . Arthritis   . Complication of anesthesia    " hard time due to sleep apnea - wtih colonoscopy in 2018   . GERD (gastroesophageal reflux disease)   . High cholesterol   . Hypertension   . Pneumonia    hx of   . Pre-diabetes   . Sleep apnea    cpap- does not know settings   . Swelling    in lower extremities     Past Surgical History:  Procedure Laterality Date  . COLONOSCOPY  2018  . left carpal tunnel release        Current Outpatient Medications  Medication Sig Dispense Refill  . amLODipine (NORVASC) 5 MG tablet Take 5 mg by mouth daily.  3  . aspirin EC 81 MG tablet Take 81 mg by mouth every morning.    Marland Kitchen atorvastatin (LIPITOR) 40 MG tablet Take 40 mg by mouth daily.   3  . hydrochlorothiazide (HYDRODIURIL) 25 MG tablet  Take 25 mg by mouth daily.   2  . omeprazole (PRILOSEC) 20 MG capsule Take 20 mg by mouth daily.     No current facility-administered medications for this visit.     Allergies:   Reglan [metoclopramide]    Social History:  The patient  reports that he has never smoked. He has never used smokeless tobacco. He reports that he does not drink alcohol or use drugs.   Family History:  The patient's family history includes Diabetes in his father and mother; Hypertension in his father and mother; Lung cancer in his father; Stroke in his mother.    ROS: All other systems are reviewed and negative. Unless otherwise mentioned in H&P    PHYSICAL EXAM: VS:  BP 122/71   Pulse (!) 59   Ht 5\' 6"  (1.676 m)   Wt (!) 316 lb (143.3 kg)   BMI 51.00 kg/m  , BMI Body mass index is 51 kg/m. GEN: Well nourished, well developed, in no acute distress, morbidly obese.  HEENT: normal Neck: no JVD, carotid bruits, or masses Cardiac: RRR; no murmurs, rubs, or gallops,no edema  Respiratory:  Clear to auscultation bilaterally, normal work of breathing GI: soft,  nontender, nondistended, + BS MS: no deformity or atrophy Skin: warm and dry, no rash Neuro:  Strength and sensation are intact Psych: euthymic mood, full affect   EKG:    Recent Labs: 10/10/2018: ALT 21 12/20/2018: BUN 12; Creatinine, Ser 0.90; Hemoglobin 14.1; Platelets 338; Potassium 3.7; Sodium 138    Lipid Panel    Component Value Date/Time   CHOL 108 06/05/2018 0812   TRIG 85 06/05/2018 0812   HDL 27 (L) 06/05/2018 0812   CHOLHDL 4.0 06/05/2018 0812   LDLCALC 64 06/05/2018 0812      Wt Readings from Last 3 Encounters:  01/08/19 (!) 316 lb (143.3 kg)  12/20/18 (!) 315 lb (142.9 kg)  11/05/18 (!) 307 lb 3.2 oz (139.3 kg)      Other studies Reviewed: CT of the chest 10/10/2018  1. No evidence for acute pulmonary embolus. No acute cardiopulmonary abnormalities identified. 2.  Aortic Atherosclerosis  (ICD10-I70.0).  ASSESSMENT AND PLAN:  1. Atypical Chest Pain: Has this chronically and appears more musculoskeletal in etiology. Usually occurs with movement of the shoulder and into the pectoral muscle. I have advised him to see PCP or orthopedic physician for further assessment. CT scan was negative for coronary calcifications.   2.  Hypertension: Well controlled on current regimen.   3. OSA: Not wearing CPAP. He has not seen sleep physician for management, and does not remember his name. He thinks it was neurologist. He is agreeable to see Dr. Tresa Endo for sleep medicine establishment.  A referral is made for him to be seen.   4. DOE: Multifactorial. Obesity and OSA contributing. He states he has lost 40 lbs with diet, but does not exercise due to knee pain. I have advised him to continue weight loss and begin low level walking program.      Current medicines are reviewed at length with the patient today.    Labs/ tests ordered today include: None Bettey Mare. [name], ANP, AACC   01/08/2019 9:00 AM    [facility] [address] Office [phone] Fax [phone]

## 2019-01-08 ENCOUNTER — Encounter: Payer: Self-pay | Admitting: Adult Health

## 2019-01-08 ENCOUNTER — Ambulatory Visit (INDEPENDENT_AMBULATORY_CARE_PROVIDER_SITE_OTHER): Payer: Managed Care, Other (non HMO) | Admitting: Adult Health

## 2019-01-08 VITALS — BP 122/71 | HR 59 | Ht 66.0 in | Wt 316.0 lb

## 2019-01-08 DIAGNOSIS — R0609 Other forms of dyspnea: Secondary | ICD-10-CM

## 2019-01-08 DIAGNOSIS — G4733 Obstructive sleep apnea (adult) (pediatric): Secondary | ICD-10-CM

## 2019-01-08 DIAGNOSIS — R079 Chest pain, unspecified: Secondary | ICD-10-CM

## 2019-01-08 DIAGNOSIS — I10 Essential (primary) hypertension: Secondary | ICD-10-CM

## 2019-01-08 DIAGNOSIS — R0600 Dyspnea, unspecified: Secondary | ICD-10-CM

## 2019-01-08 NOTE — Patient Instructions (Signed)
Special Instructions: DISCUSS CPAP W/SLEEP DEPARTMENT  CALL ORTHO FOR APPT FOR YOUR MUSCULOSKELETAL PAIN  Follow-Up: You will need a follow up appointment in 12 months.  Please call our office 2 months in advance, JAN 2021 to schedule this, MARCH 2021  appointment.  You may see Peter Swaziland, MD or one of the following Advanced Practice Providers on your designated Care Team:  Azalee Course, PA-C  Micah Flesher, [place]       Medication Instructions:  NO CHANGES- Your physician recommends that you continue on your current medications as directed. Please refer to the Current Medication list given to you today. If you need a refill on your cardiac medications before your next appointment, please call your pharmacy. Labwork: When you have labs (blood work) and your tests are completely normal, you will receive your results ONLY by MyChart Message (if you have MyChart) -OR- A paper copy in the mail.  At [facility], you and your health needs are our priority.  As part of our continuing mission to provide you with exceptional heart care, we have created designated Provider Care Teams.  These Care Teams include your primary Cardiologist (physician) and Advanced Practice Providers (APPs -  Physician Assistants and Nurse Practitioners) who all work together to provide you with the care you need, when you need it.  Thank you for choosing [facility] at [facility]!!

## 2019-01-25 ENCOUNTER — Telehealth: Payer: Managed Care, Other (non HMO) | Admitting: Physician Assistant

## 2019-01-25 DIAGNOSIS — R509 Fever, unspecified: Secondary | ICD-10-CM

## 2019-01-25 DIAGNOSIS — Z20828 Contact with and (suspected) exposure to other viral communicable diseases: Secondary | ICD-10-CM

## 2019-01-25 DIAGNOSIS — R6889 Other general symptoms and signs: Secondary | ICD-10-CM

## 2019-01-25 NOTE — Progress Notes (Signed)
  E-Visit for Corona Virus Screening  Based on what you have shared with me, you need to seek an evaluation for a severe illness that is causing your symptoms which may be coronavirus or some other illness. I recommend that you be seen and evaluated "face to face". Our Emergency Departments are best equipped to handle patients with severe symptoms.   I recommend the following:  . If you are having a true medical emergency please call 911. . If you are considered high risk for Corona virus because of a known exposure, fever, shortness of breath and cough, OR if you have severe symptoms of any kind, seek medical care at an emergency room.  . Please call ahead and tell them that you were seen by telemedicine and they have recommended that you have a face to face evaluation. . [facility] Emergency Department [address], [place], [place] [address] [phone]  . [facility] [place] Emergency Department [address], [place], [place] [address] [phone]  . [facility] Emergency Department [address], [place], [place] [address] [phone]  . [facility] Emergency Department [address], [place], [place] [address] [phone]  .  [facility] Emergency Department [address], [place], [place] [address] [phone]  NOTE: If you entered your credit card information for this eVisit, you will not be charged. You may see a "hold" on your card for the $35 but that hold will drop off and you will not have a charge processed.   Your e-visit answers were reviewed by a board certified advanced clinical practitioner to complete your personal care plan.  Thank you for using e-Visits.  

## 2019-01-28 ENCOUNTER — Emergency Department (HOSPITAL_COMMUNITY): Payer: Commercial Indemnity

## 2019-01-28 ENCOUNTER — Emergency Department (HOSPITAL_COMMUNITY)
Admission: EM | Admit: 2019-01-28 | Discharge: 2019-01-28 | Disposition: A | Discharge status: Home / Self Care | Payer: Commercial Indemnity | Attending: Emergency Medicine | Admitting: Emergency Medicine

## 2019-01-28 ENCOUNTER — Encounter (HOSPITAL_COMMUNITY): Payer: Self-pay | Admitting: Emergency Medicine

## 2019-01-28 ENCOUNTER — Other Ambulatory Visit: Payer: Self-pay

## 2019-01-28 DIAGNOSIS — E7800 Pure hypercholesterolemia, unspecified: Secondary | ICD-10-CM | POA: Insufficient documentation

## 2019-01-28 DIAGNOSIS — Z79899 Other long term (current) drug therapy: Secondary | ICD-10-CM | POA: Insufficient documentation

## 2019-01-28 DIAGNOSIS — R0602 Shortness of breath: Secondary | ICD-10-CM | POA: Diagnosis present

## 2019-01-28 DIAGNOSIS — R0789 Other chest pain: Secondary | ICD-10-CM

## 2019-01-28 DIAGNOSIS — I10 Essential (primary) hypertension: Secondary | ICD-10-CM | POA: Insufficient documentation

## 2019-01-28 DIAGNOSIS — Z7982 Long term (current) use of aspirin: Secondary | ICD-10-CM | POA: Insufficient documentation

## 2019-01-28 DIAGNOSIS — E785 Hyperlipidemia, unspecified: Secondary | ICD-10-CM | POA: Insufficient documentation

## 2019-01-28 DIAGNOSIS — J069 Acute upper respiratory infection, unspecified: Secondary | ICD-10-CM | POA: Diagnosis not present

## 2019-01-28 LAB — CBC WITH DIFFERENTIAL/PLATELET
Abs Immature Granulocytes: 0.04 10*3/uL (ref 0.00–0.07)
Basophils Absolute: 0 10*3/uL (ref 0.0–0.1)
Basophils Relative: 0 %
Eosinophils Absolute: 0 10*3/uL (ref 0.0–0.5)
Eosinophils Relative: 1 %
HCT: 45.5 % (ref 39.0–52.0)
Hemoglobin: 14.9 g/dL (ref 13.0–17.0)
Immature Granulocytes: 1 %
Lymphocytes Relative: 36 %
Lymphs Abs: 3.1 10*3/uL (ref 0.7–4.0)
MCH: 27.4 pg (ref 26.0–34.0)
MCHC: 32.7 g/dL (ref 30.0–36.0)
MCV: 83.8 fL (ref 80.0–100.0)
Monocytes Absolute: 0.8 10*3/uL (ref 0.1–1.0)
Monocytes Relative: 9 %
Neutro Abs: 4.6 10*3/uL (ref 1.7–7.7)
Neutrophils Relative %: 53 %
Platelets: 335 10*3/uL (ref 150–400)
RBC: 5.43 MIL/uL (ref 4.22–5.81)
RDW: 15.1 % (ref 11.5–15.5)
WBC: 8.6 10*3/uL (ref 4.0–10.5)
nRBC: 0 % (ref 0.0–0.2)

## 2019-01-28 LAB — BASIC METABOLIC PANEL
Anion gap: 9 (ref 5–15)
BUN: 9 mg/dL (ref 6–20)
CO2: 25 mmol/L (ref 22–32)
Calcium: 9 mg/dL (ref 8.9–10.3)
Chloride: 104 mmol/L (ref 98–111)
Creatinine, Ser: 1.02 mg/dL (ref 0.61–1.24)
GFR calc Af Amer: >60 mL/min (ref >60)
GFR calc non Af Amer: >60 mL/min (ref >60)
Glucose, Bld: 99 mg/dL (ref 70–99)
Potassium: 3.7 mmol/L (ref 3.5–5.1)
Sodium: 138 mmol/L (ref 135–145)

## 2019-01-28 LAB — INFLUENZA PANEL BY PCR (TYPE A & B)
Influenza A By PCR: NEGATIVE
Influenza B By PCR: NEGATIVE

## 2019-01-28 LAB — TROPONIN I: Troponin I: <0.03 ng/mL (ref <0.03)

## 2019-01-28 MED ORDER — SODIUM CHLORIDE 0.9 % IV BOLUS: 500.0000 mL | Freq: Once | INTRAVENOUS | Status: DC

## 2019-01-28 MED ORDER — ALBUTEROL SULFATE HFA 108 (90 BASE) MCG/ACT IN AERS
8.0000 | INHALATION_SPRAY | Freq: Once | RESPIRATORY_TRACT | Status: AC
  Administered 2019-01-28: 8 via RESPIRATORY_TRACT
  Filled 2019-01-28: qty 6.7

## 2019-01-28 MED ORDER — ACETAMINOPHEN 325 MG PO TABS
650.0000 mg | ORAL_TABLET | Freq: Once | ORAL | Status: AC
  Administered 2019-01-28: 650 mg via ORAL
  Filled 2019-01-28: qty 2

## 2019-01-28 MED ORDER — ASPIRIN 81 MG PO CHEW
324.0000 mg | CHEWABLE_TABLET | Freq: Once | ORAL | Status: AC
  Administered 2019-01-28: 324 mg via ORAL
  Filled 2019-01-28: qty 4

## 2019-01-28 MED ORDER — AEROCHAMBER PLUS FLO-VU LARGE MISC
1.0000 | Freq: Once | Status: AC
  Administered 2019-01-28: 1
  Filled 2019-01-28 (×3): qty 1

## 2019-01-28 MED ORDER — AEROCHAMBER PLUS FLO-VU LARGE MISC
Status: AC
  Filled 2019-01-28: qty 1

## 2019-01-28 NOTE — ED Triage Notes (Signed)
Cp and sob since yesterday had n/v/d last week  But not this week

## 2019-01-28 NOTE — ED Provider Notes (Signed)
[facility] EMERGENCY DEPARTMENT Provider Note   CSN: [id number] Arrival date & time: 01/28/19  1109    History   Chief Complaint Chief Complaint  Patient presents with  . Chest Pain  . Shortness of Breath    HPI [name] is a 48 y.o. male.     HPI   Pt is a 48 y/o male with a h/o angina, arthritis, GERD, HTN, HLD, PNA, pre-diabetes, CPAP,  who presents to the ED today for evaluation of sob and cp that began last night. States chest pain occcured while sitting on recliner. states pain lasted about 3-4 moinutes and resolved spontaneously. He has had no recurrence of pain currently rates it 0/10. Pain was located to the middle of the chest. Pain did not radiate. Denies associated nausea.   Does report that he has felt sob for same time period.No le swelling. No pain with inspiration.  Reports he had a dry cough starting yesterday. Also reports rhinorrhea, congestion that started 1-2 weeks ago. States he had fevers a few days ago, but this has resolved. Had nausea diarrhea last week that has resolved. No abd pain.  Denies leg pain/swelling, hemoptysis, recent surgery/trauma, hormone use, personal hx of cancer, or hx of DVT/PE. Drove 5 hours to [place], [place] last week but stopped several times there and back.    States that someone in his wife's office was diagnosed with coronavirus.  On review of records pt had telehealth visit with pcp yesterday. He c/o sob and cp and was advised to come to the ed for further eval.  Past Medical History:  Diagnosis Date  . Anginal pain (HCC)    04/2011 pt had agina, seen by cardiologist, no further treatment  . Arthritis   . Complication of anesthesia    " hard time due to sleep apnea - wtih colonoscopy in 2018   . GERD (gastroesophageal reflux disease)   . High cholesterol   . Hypertension   . Pneumonia    hx of   . Pre-diabetes   . Sleep apnea    cpap- does not know settings   . Swelling    in lower  extremities     Patient Active Problem List   Diagnosis Date Noted  . Seasonal allergies 12/21/2018  . Plantar fasciitis 12/21/2018  . Right testicular pain 12/29/2016  . Class 3 severe obesity due to excess calories with body mass index (BMI) of 50.0 to 59.9 in adult (HCC) 11/24/2016  . Prediabetes 10/20/2016  . Obstructive sleep apnea of adult 10/20/2016  . OA (osteoarthritis) 10/20/2016  . HLD (hyperlipidemia) 10/20/2016  . Essential hypertension 10/20/2016  . Early stage glaucoma 10/20/2016  . Insomnia 08/26/2011  . Depression 08/26/2011  . Erectile dysfunction 08/19/2011  . Testicular/scrotal pain 08/09/2011  . Sinusitis 08/09/2011  . Pain in joints 03/03/2011  . Injury, other and unspecified, knee, leg, ankle, and foot 03/03/2011  . Gastro-esophageal reflux disease without esophagitis 12/16/2010  . Adjustment disorder with anxiety 12/16/2010  . Impaired fasting glucose 11/19/2010  . Morbid obesity (HCC) 11/19/2010    Past Surgical History:  Procedure Laterality Date  . COLONOSCOPY  2018  . left carpal tunnel release           Home Medications    Prior to Admission medications   Medication Sig Start Date End Date Taking? Authorizing Provider  amLODipine (NORVASC) 5 MG tablet Take 5 mg by mouth daily. 05/31/17   [provider]  aspirin EC 81  MG tablet Take 81 mg by mouth every morning.    [provider]  atorvastatin (LIPITOR) 40 MG tablet Take 40 mg by mouth daily.  02/14/17   [provider]  hydrochlorothiazide (HYDRODIURIL) 25 MG tablet Take 25 mg by mouth daily.  02/14/17   [provider]  omeprazole (PRILOSEC) 20 MG capsule Take 20 mg by mouth daily.    [provider]    Family History Family History  Problem Relation Age of Onset  . Diabetes Mother   . Hypertension Mother   . Stroke Mother   . Hypertension Father   . Diabetes Father   . Lung cancer Father     Social History Social History   Tobacco  Use  . Smoking status: Never Smoker  . Smokeless tobacco: Never Used  Substance Use Topics  . Alcohol use: No    Comment: 1-2 x per month   . Drug use: No     Allergies   Reglan [metoclopramide]   Review of Systems Review of Systems  Constitutional: Positive for fever (resolved). Negative for chills.  HENT: Positive for congestion and rhinorrhea. Negative for ear pain and sore throat.   Eyes: Negative for pain and visual disturbance.  Respiratory: Positive for cough and shortness of breath.   Cardiovascular: Positive for chest pain (resolved). Negative for leg swelling.  Gastrointestinal: Positive for diarrhea (resolved) and nausea (resolved). Negative for abdominal pain and vomiting.  Genitourinary: Negative for flank pain.  Musculoskeletal: Negative for back pain.  Skin: Negative for rash.  Neurological: Positive for headaches. Negative for dizziness, weakness, light-headedness and numbness.  All other systems reviewed and are negative.  Physical Exam Updated Vital Signs BP 134/67 (BP Location: Right Arm)   Pulse 82   Temp 98.6 F (37 C) (Oral)   Resp (!) 21   SpO2 99%   Physical Exam Vitals signs and nursing note reviewed.  Constitutional:      Appearance: He is well-developed. He is not diaphoretic.  HENT:     Head: Normocephalic and atraumatic.  Eyes:     Conjunctiva/sclera: Conjunctivae normal.  Neck:     Musculoskeletal: Neck supple.  Cardiovascular:     Rate and Rhythm: Normal rate and regular rhythm.     Heart sounds: Normal heart sounds. No murmur.  Pulmonary:     Effort: Pulmonary effort is normal. No respiratory distress.     Breath sounds: Normal breath sounds. No decreased breath sounds, wheezing, rhonchi or rales.     Comments: satting at 100% on RA. Mildly tachypneic on monitor. Speaking in full sentences.  Abdominal:     Palpations: Abdomen is soft.     Tenderness: There is no abdominal tenderness.  Musculoskeletal:     Comments: Trace  pitting edema to ble, no calf ttp  Skin:    General: Skin is warm and dry.  Neurological:     Mental Status: He is alert.      ED Treatments / Results  Labs (all labs ordered are listed, but only abnormal results are displayed) Labs Reviewed  CBC WITH DIFFERENTIAL/PLATELET  BASIC METABOLIC PANEL  TROPONIN I  INFLUENZA PANEL BY PCR (TYPE A & B)    EKG EKG Interpretation  Date/Time:  Monday January 28 2019 11:21:40 EDT Ventricular Rate:  81 PR Interval:    QRS Duration: 104 QT Interval:  389 QTC Calculation: 452 R Axis:   3 Text Interpretation:  Sinus rhythm Borderline T wave abnormalities Confirmed by Raeford Razor [phone]) on  01/28/2019 11:35:22 AM   Radiology Dg Chest Portable 1 View  Result Date: 01/28/2019 CLINICAL DATA:  Shortness of breath and central chest pain since yesterday, dry cough, history at hypertension, pneumonia EXAM: PORTABLE CHEST 1 VIEW COMPARISON:  Portable exam 1250 hours compared to 12/20/2018 FINDINGS: Upper normal heart size. Mediastinal contours and pulmonary vascularity normal. Lungs clear. No infiltrate, pleural effusion or pneumothorax. IMPRESSION: No acute abnormalities. Electronically Signed   By: Ulyses Southward M.D.   On: 01/28/2019 13:16    Procedures Procedures (including critical care time)  Medications Ordered in ED Medications  sodium chloride 0.9 % bolus 500 mL (500 mLs Intravenous Not Given 01/28/19 1448)  AeroChamber Plus Flo-Vu Large MISC (has no administration in time range)  albuterol (PROVENTIL HFA;VENTOLIN HFA) 108 (90 Base) MCG/ACT inhaler 8 puff (8 puffs Inhalation Given 01/28/19 1331)  AeroChamber Plus Flo-Vu Large MISC 1 each (1 each Other Given 01/28/19 1335)  acetaminophen (TYLENOL) tablet 650 mg (650 mg Oral Given 01/28/19 1331)  aspirin chewable tablet 324 mg (324 mg Oral Given 01/28/19 1330)     Initial Impression / Assessment and Plan / ED Course  I have reviewed the triage vital signs and the nursing notes.  Pertinent  labs & imaging results that were available during my care of the patient were reviewed by me and considered in my medical decision making (see chart for details).  Final Clinical Impressions(s) / ED Diagnoses   Final diagnoses:  Upper respiratory tract infection, unspecified type  Atypical chest pain   Pt here with sob, cp (resolved), cough, and fevers (resolved). He is concerned for coronavirus as his wife's co-worker recently tested positive.   He is nontoxic and nonseptic appearing on exam. He is afebrile and is satting at 100% on RA on my eval. Very mildly tachypneic. Otherwise normal vs.  Lungs CTAB. Heart with RRR. No significant evidence of fluid overload.  CBC, BMP, Trop are all reassuring.  Influenza is negative  EKG with NSR, Borderline T wave abnormalities  CXR negative  With regard to CP. Pt has been seen multiple times for cp within the last several months and he has left ama twice. He had f/u with cardiology 10/2018 with Dr. Yetta Barre who reviewed his recent CT PE scan and did not see any evidence of coronary calcification at that time. He felt that pts sxs at that time seemed atypical and he did not recommend any further workup at that time. Today, pts sxs also sound atypical and his workup is reassuring. I have very low suspicion for ACS.  I feel that he is likely safe for discharge with close f/u with cardiology. He seems to be more worried about potential for COVID with his sob. He reports he feels 100% better after albuterol inhaler, asa and tylenol. Advised to continue inhaler use. advised to f/u with cards and pcp in regards to sxs. Advised on strict return precautions.  He voiced understanding the plan and reasons to return to the ED.  All questions answered.  Patient stable for discharge.  --------  Marlan Palau was evaluated in Emergency Department on 01/28/2019 for the symptoms described in the history of present illness. He was evaluated in the context of the global  COVID-19 pandemic, which necessitated consideration that the patient might be at risk for infection with the SARS-CoV-2 virus that causes COVID-19. Institutional protocols and algorithms that pertain to the evaluation of patients at risk for COVID-19 are in a state of rapid change based on information  released by regulatory bodies including the CDC and federal and state organizations. These policies and algorithms were followed during the patient's care in the ED. Pt does not meet criteria for testing for COVID per current hospital protocol as he does not require admission. Advised self quarantine and close monitoring of sxs.   ED Discharge Orders    None       Rayne Du 01/28/19 1654    Raeford Razor, MD 01/29/19 1046

## 2019-01-28 NOTE — Discharge Instructions (Addendum)
Call your cardiologist and tell them about your visit to the ED.   Take two puffs of the albuterol inhaler twice every 6 hours.  You should be isolated for at least 7 days since the onset of your symptoms AND >72 hours after symptoms resolution (absence of fever without the use of fever reducing medication and improvement in respiratory symptoms), whichever is longer  Please follow up with your primary care provider within 3 days for re-evaluation of your symptoms.  Please return to the emergency department for any new or worsening symptoms.

## 2019-01-28 NOTE — ED Notes (Signed)
Patient ambulated with pulse ox.  SaO2 97.  During ambulation SaO2 was 95%. Upon return to room SaO2 97%

## 2019-04-19 ENCOUNTER — Encounter: Payer: Self-pay | Admitting: Emergency Medicine

## 2019-04-19 ENCOUNTER — Other Ambulatory Visit: Payer: Self-pay

## 2019-04-19 ENCOUNTER — Emergency Department: Payer: No Typology Code available for payment source

## 2019-04-19 ENCOUNTER — Emergency Department
Admission: EM | Admit: 2019-04-19 | Discharge: 2019-04-19 | Disposition: A | Discharge status: Home / Self Care | Payer: No Typology Code available for payment source | Attending: Emergency Medicine | Admitting: Emergency Medicine

## 2019-04-19 DIAGNOSIS — R079 Chest pain, unspecified: Secondary | ICD-10-CM | POA: Diagnosis present

## 2019-04-19 DIAGNOSIS — Z7982 Long term (current) use of aspirin: Secondary | ICD-10-CM | POA: Insufficient documentation

## 2019-04-19 DIAGNOSIS — Y9389 Activity, other specified: Secondary | ICD-10-CM | POA: Diagnosis not present

## 2019-04-19 DIAGNOSIS — Y990 Civilian activity done for income or pay: Secondary | ICD-10-CM | POA: Diagnosis not present

## 2019-04-19 DIAGNOSIS — Z79899 Other long term (current) drug therapy: Secondary | ICD-10-CM | POA: Diagnosis not present

## 2019-04-19 DIAGNOSIS — I10 Essential (primary) hypertension: Secondary | ICD-10-CM | POA: Diagnosis not present

## 2019-04-19 DIAGNOSIS — Y929 Unspecified place or not applicable: Secondary | ICD-10-CM | POA: Diagnosis not present

## 2019-04-19 DIAGNOSIS — S29011A Strain of muscle and tendon of front wall of thorax, initial encounter: Secondary | ICD-10-CM | POA: Diagnosis not present

## 2019-04-19 DIAGNOSIS — R0789 Other chest pain: Secondary | ICD-10-CM

## 2019-04-19 DIAGNOSIS — X509XXA Other and unspecified overexertion or strenuous movements or postures, initial encounter: Secondary | ICD-10-CM | POA: Insufficient documentation

## 2019-04-19 LAB — CBC WITH DIFFERENTIAL/PLATELET
Abs Immature Granulocytes: 0.04 10*3/uL (ref 0.00–0.07)
Basophils Absolute: 0 10*3/uL (ref 0.0–0.1)
Basophils Relative: 0 %
Eosinophils Absolute: 0 10*3/uL (ref 0.0–0.5)
Eosinophils Relative: 0 %
HCT: 43.7 % (ref 39.0–52.0)
Hemoglobin: 14.2 g/dL (ref 13.0–17.0)
Immature Granulocytes: 1 %
Lymphocytes Relative: 31 %
Lymphs Abs: 2.3 10*3/uL (ref 0.7–4.0)
MCH: 27.3 pg (ref 26.0–34.0)
MCHC: 32.5 g/dL (ref 30.0–36.0)
MCV: 84 fL (ref 80.0–100.0)
Monocytes Absolute: 0.6 10*3/uL (ref 0.1–1.0)
Monocytes Relative: 8 %
Neutro Abs: 4.5 10*3/uL (ref 1.7–7.7)
Neutrophils Relative %: 60 %
Platelets: 309 10*3/uL (ref 150–400)
RBC: 5.2 MIL/uL (ref 4.22–5.81)
RDW: 14.7 % (ref 11.5–15.5)
WBC: 7.5 10*3/uL (ref 4.0–10.5)
nRBC: 0 % (ref 0.0–0.2)

## 2019-04-19 LAB — COMPREHENSIVE METABOLIC PANEL
ALT: 25 U/L (ref 0–44)
AST: 20 U/L (ref 15–41)
Albumin: 4 g/dL (ref 3.5–5.0)
Alkaline Phosphatase: 77 U/L (ref 38–126)
Anion gap: 9 (ref 5–15)
BUN: 12 mg/dL (ref 6–20)
CO2: 24 mmol/L (ref 22–32)
Calcium: 9 mg/dL (ref 8.9–10.3)
Chloride: 104 mmol/L (ref 98–111)
Creatinine, Ser: 0.98 mg/dL (ref 0.61–1.24)
GFR calc Af Amer: >60 mL/min (ref >60)
GFR calc non Af Amer: >60 mL/min (ref >60)
Glucose, Bld: 84 mg/dL (ref 70–99)
Potassium: 3.7 mmol/L (ref 3.5–5.1)
Sodium: 137 mmol/L (ref 135–145)
Total Bilirubin: 1 mg/dL (ref 0.3–1.2)
Total Protein: 7.9 g/dL (ref 6.5–8.1)

## 2019-04-19 LAB — TROPONIN I: Troponin I: <0.03 ng/mL (ref <0.03)

## 2019-04-19 MED ORDER — HYDROCODONE-ACETAMINOPHEN 5-325 MG PO TABS: 1.0000 | ORAL_TABLET | Freq: Four times a day (QID) | ORAL | 0 refills | Status: AC | PRN

## 2019-04-19 MED ORDER — HYDROCODONE-ACETAMINOPHEN 5-325 MG PO TABS
1.0000 | ORAL_TABLET | Freq: Once | ORAL | Status: AC
  Administered 2019-04-19: 1 via ORAL
  Filled 2019-04-19: qty 1

## 2019-04-19 NOTE — ED Triage Notes (Signed)
Went to [employer] for worker comp injury to chest while he was at work--lifting/reaching movement.  Says it hurts so bad that he feels like he cant breath.  So kc broguht him here. No distress.  Per scott rhodes safety manager--no post accident drug screen needed.

## 2019-04-19 NOTE — Discharge Instructions (Addendum)
Follow-up with your primary care provider if any continued problems.  Begin taking Norco 1 every 6 hours if needed for pain.  You may also take ibuprofen with this medication if additional pain medication as needed.  Use ice to your chest to help with pain control.  No lifting more than 10 pounds over the weekend.  Return to the emergency department if any severe worsening of your symptoms.

## 2019-04-19 NOTE — ED Provider Notes (Signed)
[facility] Emergency Department Provider Note  ____________________________________________   First MD Initiated Contact with Patient 04/19/19 1406     (approximate)  I have reviewed the triage vital signs and the nursing notes.   HISTORY  Chief Complaint Shortness of Breath   HPI [name] is a 48 y.o. male is brought to the ED by [facility] acute care for evaluation of his chest pain.  Patient states that he went there for Workmen's Comp. injury and they were not comfortable with his chest pain and brought him to the ED.  Pain occurred when he was reaching and lifting at the same time.  He states that the pain was so bad that he "could not catch his breath".  He has not taken any over-the-counter medication prior to arrival.  He denies any previous cardiac problems but has seen a cardiologist in the past.  He denies any shortness of breath at this time, no diaphoresis, nausea or vomiting.  Currently rates pain as 9/10.     Past Medical History:  Diagnosis Date  . Anginal pain ([place])    04/2011 pt had agina, seen by cardiologist, no further treatment  . Arthritis   . Complication of anesthesia    " hard time due to sleep apnea - wtih colonoscopy in 2018   . GERD (gastroesophageal reflux disease)   . High cholesterol   . Hypertension   . Pneumonia    hx of   . Pre-diabetes   . Sleep apnea    cpap- does not know settings   . Swelling    in lower extremities     Patient Active Problem List   Diagnosis Date Noted  . Seasonal allergies 12/21/2018  . Plantar fasciitis 12/21/2018  . Right testicular pain 12/29/2016  . Class 3 severe obesity due to excess calories with body mass index (BMI) of 50.0 to 59.9 in adult ([place]) 11/24/2016  . Prediabetes 10/20/2016  . Obstructive sleep apnea of adult 10/20/2016  . OA (osteoarthritis) 10/20/2016  . HLD (hyperlipidemia) 10/20/2016  . Essential hypertension 10/20/2016  . Early stage glaucoma  10/20/2016  . Insomnia 08/26/2011  . Depression 08/26/2011  . Erectile dysfunction 08/19/2011  . Testicular/scrotal pain 08/09/2011  . Sinusitis 08/09/2011  . Pain in joints 03/03/2011  . Injury, other and unspecified, knee, leg, ankle, and foot 03/03/2011  . Gastro-esophageal reflux disease without esophagitis 12/16/2010  . Adjustment disorder with anxiety 12/16/2010  . Impaired fasting glucose 11/19/2010  . Morbid obesity ([place]) 11/19/2010    Past Surgical History:  Procedure Laterality Date  . COLONOSCOPY  2018  . left carpal tunnel release       Prior to Admission medications   Medication Sig Start Date End Date Taking? Authorizing Provider  amLODipine (NORVASC) 5 MG tablet Take 5 mg by mouth daily. 05/31/17   [provider]  aspirin EC 81 MG tablet Take 81 mg by mouth every morning.    [provider]  atorvastatin (LIPITOR) 40 MG tablet Take 40 mg by mouth daily.  02/14/17   [provider]  hydrochlorothiazide (HYDRODIURIL) 25 MG tablet Take 25 mg by mouth daily.  02/14/17   [provider]  HYDROcodone-acetaminophen (NORCO/VICODIN) 5-325 MG tablet Take 1 tablet by mouth every 6 (six) hours as needed for moderate pain. 04/19/19   Johnn Hai, PA-C  omeprazole (PRILOSEC) 20 MG capsule Take 20 mg by mouth daily.    [provider]    Allergies Reglan [  metoclopramide]  Family History  Problem Relation Age of Onset  . Diabetes Mother   . Hypertension Mother   . Stroke Mother   . Hypertension Father   . Diabetes Father   . Lung cancer Father     Social History Social History   Tobacco Use  . Smoking status: Never Smoker  . Smokeless tobacco: Never Used  Substance Use Topics  . Alcohol use: No    Comment: 1-2 x per month   . Drug use: No    Review of Systems Constitutional: No fever/chills Eyes: No visual changes. Cardiovascular: Denies chest pain. Respiratory: Denies shortness of breath. Gastrointestinal: No  abdominal pain.  No nausea, no vomiting.  Musculoskeletal: Anterior chest wall pain. Skin: Negative for rash. Neurological: Negative for headaches, focal weakness or numbness.  ____________________________________________   PHYSICAL EXAM:  VITAL SIGNS: ED Triage Vitals  Enc Vitals Group     BP 04/19/19 1231 (!) 145/82     Pulse Rate 04/19/19 1231 69     Resp 04/19/19 1231 18     Temp 04/19/19 1231 98.5 F (36.9 C)     Temp Source 04/19/19 1231 Oral     SpO2 04/19/19 1231 96 %     Weight 04/19/19 1216 (!) 320 lb (145.2 kg)     Height 04/19/19 1216 5\' 6"  (1.676 m)     Head Circumference --      Peak Flow --      Pain Score 04/19/19 1214 9     Pain Loc --      Pain Edu? --      Excl. in GC? --    Constitutional: Alert and oriented. Well appearing and in no acute distress. Eyes: Conjunctivae are normal.  Head: Atraumatic. Neck: No stridor.   Cardiovascular: Normal rate, regular rhythm. Grossly normal heart sounds.  Good peripheral circulation. Respiratory: Normal respiratory effort.  No retractions. Lungs CTAB. Gastrointestinal: Soft and nontender. No distention.  Musculoskeletal: There is point tenderness on palpation of the sternum and also on the left anterior chest.  Range of motion in the left shoulder reproduces patient's pain.  There is also point tenderness on palpation of the posterior left shoulder.  Range of motion is without crepitus.  No gross deformity or soft tissue injury is noted.  No point tenderness is noted on palpation of the thoracic spine. Neurologic:  Normal speech and language. No gross focal neurologic deficits are appreciated. No gait instability. Skin:  Skin is warm, dry and intact. No rash noted. Psychiatric: Mood and affect are normal. Speech and behavior are normal.  ____________________________________________   LABS (all labs ordered are listed, but only abnormal results are displayed)  Labs Reviewed  CBC WITH DIFFERENTIAL/PLATELET   TROPONIN I  COMPREHENSIVE METABOLIC PANEL   ____________________________________________  EKG  EKG was reviewed at Dr. station.  Normal sinus rhythm with a ventricular rate of 68, PR interval 148, QRS duration 96. ____________________________________________  RADIOLOGY  Official radiology report(s): Dg Chest 2 View  Result Date: 04/19/2019 CLINICAL DATA:  Chest pain EXAM: CHEST - 2 VIEW COMPARISON:  01/28/2019, 12/20/2018 FINDINGS: The heart size and mediastinal contours are within normal limits. Both lungs are clear. The visualized skeletal structures are unremarkable. IMPRESSION: No active cardiopulmonary disease. Electronically Signed   By: Jasmine PangKim  Fujinaga M.D.   On: 04/19/2019 15:17    ____________________________________________   PROCEDURES  Procedure(s) performed (including Critical Care):  Procedures   ____________________________________________   INITIAL IMPRESSION / ASSESSMENT AND PLAN / ED COURSE  As part of my medical decision making, I reviewed the following data within the electronic MEDICAL RECORD NUMBER Notes from prior ED visits and [place] Controlled Substance Database  48 year old male presents to [facility] ED [facility] with complaint of chest pain that began while he was at work.  Patient states that he was lifting and pulling at the same time he experienced anterior chest wall pain without other symptoms.  Patient denied any previous cardiac issues but states he has been evaluated by cardiologist for his hypertension.  EKG and chest x-ray are reassuring.  Troponin test was negative and patient was made aware.  Patient was given Norco prior to discharge since he is not driving.  He is to continue with Norco every 6 hours as needed for pain.  He is encouraged to use ice and ibuprofen to help with his chest wall strain.  Patient may return to work with limited lifting at 10 pounds.  He is encouraged to go to the ED if any severe worsening of his symptoms and  follow-up with [facility] since this is initially a Workmen's Comp. case.  ____________________________________________   FINAL CLINICAL IMPRESSION(S) / ED DIAGNOSES  Final diagnoses:  Acute chest wall pain  Muscle strain of chest wall, initial encounter     ED Discharge Orders         Ordered    HYDROcodone-acetaminophen (NORCO/VICODIN) 5-325 MG tablet  Every 6 hours PRN     04/19/19 1601           Note:  This document was prepared using Dragon voice recognition software and may include unintentional dictation errors.    Tommi RumpsSummers, Janece Laidlaw L, PA-C 04/19/19 1606    Minna AntisPaduchowski, Kevin, MD 04/20/19 0730

## 2019-04-19 NOTE — ED Notes (Signed)
Pt with chest pain r/t to work injury. Pt states hard to breath. Pt's chest expansion equal, pt talking in full sentences and is NAD at this time.

## 2019-06-06 ENCOUNTER — Other Ambulatory Visit: Payer: Self-pay

## 2019-06-06 ENCOUNTER — Ambulatory Visit (HOSPITAL_COMMUNITY)
Admission: EM | Admit: 2019-06-06 | Discharge: 2019-06-06 | Disposition: A | Discharge status: Home / Self Care | Payer: Managed Care, Other (non HMO) | Attending: Emergency Medicine | Admitting: Emergency Medicine

## 2019-06-06 ENCOUNTER — Encounter (HOSPITAL_COMMUNITY): Payer: Self-pay | Admitting: Emergency Medicine

## 2019-06-06 ENCOUNTER — Ambulatory Visit: Payer: Self-pay | Admitting: Adult Health

## 2019-06-06 ENCOUNTER — Encounter: Payer: Self-pay | Admitting: Adult Health

## 2019-06-06 ENCOUNTER — Telehealth: Payer: Managed Care, Other (non HMO)

## 2019-06-06 DIAGNOSIS — Z833 Family history of diabetes mellitus: Secondary | ICD-10-CM | POA: Insufficient documentation

## 2019-06-06 DIAGNOSIS — Z823 Family history of stroke: Secondary | ICD-10-CM | POA: Insufficient documentation

## 2019-06-06 DIAGNOSIS — Z20828 Contact with and (suspected) exposure to other viral communicable diseases: Secondary | ICD-10-CM | POA: Insufficient documentation

## 2019-06-06 DIAGNOSIS — R51 Headache: Secondary | ICD-10-CM

## 2019-06-06 DIAGNOSIS — R300 Dysuria: Secondary | ICD-10-CM | POA: Diagnosis not present

## 2019-06-06 DIAGNOSIS — R110 Nausea: Secondary | ICD-10-CM

## 2019-06-06 DIAGNOSIS — Z888 Allergy status to other drugs, medicaments and biological substances status: Secondary | ICD-10-CM | POA: Diagnosis not present

## 2019-06-06 DIAGNOSIS — Z87440 Personal history of urinary (tract) infections: Secondary | ICD-10-CM | POA: Diagnosis not present

## 2019-06-06 DIAGNOSIS — R6889 Other general symptoms and signs: Secondary | ICD-10-CM

## 2019-06-06 DIAGNOSIS — Z8249 Family history of ischemic heart disease and other diseases of the circulatory system: Secondary | ICD-10-CM | POA: Insufficient documentation

## 2019-06-06 DIAGNOSIS — R509 Fever, unspecified: Secondary | ICD-10-CM | POA: Insufficient documentation

## 2019-06-06 DIAGNOSIS — Z20822 Contact with and (suspected) exposure to covid-19: Secondary | ICD-10-CM

## 2019-06-06 DIAGNOSIS — M791 Myalgia, unspecified site: Secondary | ICD-10-CM | POA: Diagnosis not present

## 2019-06-06 LAB — POCT URINALYSIS DIP (DEVICE)
Bilirubin Urine: NEGATIVE
Glucose, UA: NEGATIVE mg/dL
Hgb urine dipstick: NEGATIVE
Ketones, ur: NEGATIVE mg/dL
Nitrite: NEGATIVE
Protein, ur: NEGATIVE mg/dL
Specific Gravity, Urine: 1.02 (ref 1.005–1.030)
Urobilinogen, UA: 0.2 mg/dL (ref 0.0–1.0)
pH: 7 (ref 5.0–8.0)

## 2019-06-06 NOTE — ED Triage Notes (Signed)
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 PT requesting covid testing.

## 2019-06-06 NOTE — Discharge Instructions (Signed)
°  You may take 500mg  acetaminophen every 4-6 hours or in combination with ibuprofen 400-600mg  every 6-8 hours as needed for pain, inflammation, and fever.  Be sure to well hydrated with clear liquids and get at least 8 hours of sleep at night, preferably more while sick.   Please follow up with family medicine in 1 week if needed.  Due to concern for possibly having Covid-19, it is advised that you self-isolate at home until test results come back.  If positive, it is recommended you stay isolated for at least 10 days after symptom onset 24 after last fever without taking medication (whichever is longer).  If you MUST go out, please wear a mask at all times, limit contact with others.

## 2019-06-06 NOTE — ED Provider Notes (Signed)
[place]    CSN: [id number] Arrival date & time: 06/06/19  9518     History   Chief Complaint Chief Complaint  Patient presents with  . Fever  . Headache    HPI [name] is a 48 y.o. male.   HPI  [name] is a 48 y.o. male presenting to UC with c/o flu-like symptoms that started yesterday while he was at work.  Pt reports body aches, chills, mild congestion, HA, fever Tmax 102.6*F yesterday. Improved with ibuprofen. Mild nausea but no vomiting or diarrhea. Pt works in the same building as the health department and is concerned he may have Covid as masks have not been required until this past week.  Denies chest pain or SOB.   Pt also reports mild dysuria. Hx of UTIs in the past.    Past Medical History:  Diagnosis Date  . Anginal pain ([place])    04/2011 pt had agina, seen by cardiologist, no further treatment  . Arthritis   . Complication of anesthesia    " hard time due to sleep apnea - wtih colonoscopy in 2018   . GERD (gastroesophageal reflux disease)   . High cholesterol   . Hypertension   . Pneumonia    hx of   . Pre-diabetes   . Sleep apnea    cpap- does not know settings   . Swelling    in lower extremities     Patient Active Problem List   Diagnosis Date Noted  . Seasonal allergies 12/21/2018  . Plantar fasciitis 12/21/2018  . Right testicular pain 12/29/2016  . Class 3 severe obesity due to excess calories with body mass index (BMI) of 50.0 to 59.9 in adult ([place]) 11/24/2016  . Prediabetes 10/20/2016  . Obstructive sleep apnea of adult 10/20/2016  . OA (osteoarthritis) 10/20/2016  . HLD (hyperlipidemia) 10/20/2016  . Essential hypertension 10/20/2016  . Early stage glaucoma 10/20/2016  . Insomnia 08/26/2011  . Depression 08/26/2011  . Erectile dysfunction 08/19/2011  . Testicular/scrotal pain 08/09/2011  . Sinusitis 08/09/2011  . Pain in joints 03/03/2011  . Injury, other and unspecified, knee, leg, ankle, and foot  03/03/2011  . Gastro-esophageal reflux disease without esophagitis 12/16/2010  . Adjustment disorder with anxiety 12/16/2010  . Impaired fasting glucose 11/19/2010  . Morbid obesity ([place]) 11/19/2010    Past Surgical History:  Procedure Laterality Date  . COLONOSCOPY  2018  . left carpal tunnel release          Home Medications    Prior to Admission medications   Medication Sig Start Date End Date Taking? Authorizing Provider  amLODipine (NORVASC) 5 MG tablet Take 5 mg by mouth daily. 05/31/17  Yes [provider]  aspirin EC 81 MG tablet Take 81 mg by mouth every morning.   Yes [provider]  atorvastatin (LIPITOR) 40 MG tablet Take 40 mg by mouth daily.  02/14/17  Yes [provider]  hydrochlorothiazide (HYDRODIURIL) 25 MG tablet Take 25 mg by mouth daily.  02/14/17  Yes [provider]  omeprazole (PRILOSEC) 20 MG capsule Take 20 mg by mouth daily.   Yes [provider]  HYDROcodone-acetaminophen (NORCO/VICODIN) 5-325 MG tablet Take 1 tablet by mouth every 6 (six) hours as needed for moderate pain. 04/19/19   Johnn Hai, PA-C    Family History Family History  Problem Relation Age of Onset  . Diabetes Mother   . Hypertension Mother   . Stroke Mother   .  Hypertension Father   . Diabetes Father   . Lung cancer Father     Social History Social History   Tobacco Use  . Smoking status: Never Smoker  . Smokeless tobacco: Never Used  Substance Use Topics  . Alcohol use: No    Comment: 1-2 x per month   . Drug use: No     Allergies   Reglan [metoclopramide]   Review of Systems Review of Systems  Constitutional: Positive for chills, fatigue and fever.  HENT: Positive for congestion. Negative for ear pain, sore throat, trouble swallowing and voice change.   Respiratory: Negative for cough and shortness of breath.   Cardiovascular: Negative for chest pain and palpitations.  Gastrointestinal: Positive for nausea.  Negative for abdominal pain, diarrhea and vomiting.  Genitourinary: Positive for dysuria. Negative for flank pain, frequency and hematuria.  Musculoskeletal: Positive for arthralgias, back pain, myalgias and neck pain. Negative for neck stiffness.  Skin: Negative for rash.  Neurological: Positive for headaches. Negative for dizziness and light-headedness.     Physical Exam Triage Vital Signs ED Triage Vitals  Enc Vitals Group     BP 06/06/19 1026 (!) 152/83     Pulse Rate 06/06/19 1026 (!) 107     Resp 06/06/19 1026 18     Temp 06/06/19 1026 99.4 F (37.4 C)     Temp Source 06/06/19 1026 Oral     SpO2 06/06/19 1026 100 %     Weight --      Height --      Head Circumference --      Peak Flow --      Pain Score 06/06/19 1024 8     Pain Loc --      Pain Edu? --      Excl. in GC? --    No data found.  Updated Vital Signs BP (!) 152/83   Pulse (!) 107   Temp 99.4 F (37.4 C) (Oral)   Resp 18   SpO2 100%   Visual Acuity Right Eye Distance:   Left Eye Distance:   Bilateral Distance:    Right Eye Near:   Left Eye Near:    Bilateral Near:     Physical Exam Vitals signs and nursing note reviewed.  Constitutional:      Appearance: He is well-developed.  HENT:     Head: Normocephalic and atraumatic.     Right Ear: Tympanic membrane normal.     Left Ear: Tympanic membrane normal.     Nose: Nose normal.     Right Sinus: No maxillary sinus tenderness or frontal sinus tenderness.     Left Sinus: No maxillary sinus tenderness or frontal sinus tenderness.     Mouth/Throat:     Lips: Pink.     Mouth: Mucous membranes are moist.     Pharynx: Oropharynx is clear. Uvula midline.  Neck:     Musculoskeletal: Normal range of motion and neck supple.  Cardiovascular:     Rate and Rhythm: Normal rate and regular rhythm.  Pulmonary:     Effort: Pulmonary effort is normal.     Breath sounds: Normal breath sounds.  Musculoskeletal: Normal range of motion.  Lymphadenopathy:      Cervical: No cervical adenopathy.  Skin:    General: Skin is warm and dry.  Neurological:     Mental Status: He is alert and oriented to person, place, and time.  Psychiatric:        Behavior: Behavior normal.  UC Treatments / Results  Labs (all labs ordered are listed, but only abnormal results are displayed) Labs Reviewed  POCT URINALYSIS DIP (DEVICE) - Abnormal; Notable for the following components:      Result Value   Leukocytes,Ua SMALL (*)    All other components within normal limits  NOVEL CORONAVIRUS, NAA (HOSPITAL ORDER, SEND-OUT TO REF LAB)  URINE CULTURE    EKG   Radiology No results found.  Procedures Procedures (including critical care time)  Medications Ordered in UC Medications - No data to display  Initial Impression / Assessment and Plan / UC Course  I have reviewed the triage vital signs and the nursing notes.  Pertinent labs & imaging results that were available during my care of the patient were reviewed by me and considered in my medical decision making (see chart for details).     High suspicion for Covid Test pending UA: small leukocytes, will hold off on antibiotic at this time, culture sent.   AVS and work note provided.  Final Clinical Impressions(s) / UC Diagnoses   Final diagnoses:  Suspected Covid-19 Virus Infection  Dysuria  Flu-like symptoms     Discharge Instructions      You may take 500mg  acetaminophen every 4-6 hours or in combination with ibuprofen 400-600mg  every 6-8 hours as needed for pain, inflammation, and fever.  Be sure to well hydrated with clear liquids and get at least 8 hours of sleep at night, preferably more while sick.   Please follow up with family medicine in 1 week if needed.  Due to concern for possibly having Covid-19, it is advised that you self-isolate at home until test results come back.  If positive, it is recommended you stay isolated for at least 10 days after symptom onset 24 after  last fever without taking medication (whichever is longer).  If you MUST go out, please wear a mask at all times, limit contact with others.      ED Prescriptions    None     Controlled Substance Prescriptions [place] Controlled Substance Registry consulted? Not Applicable   Rolla Platehelps, Milania Haubner O, PA-C 06/06/19 1153

## 2019-06-07 LAB — NOVEL CORONAVIRUS, NAA (HOSP ORDER, SEND-OUT TO REF LAB; TAT 18-24 HRS): SARS-CoV-2, NAA: NOT DETECTED

## 2019-06-10 ENCOUNTER — Encounter (HOSPITAL_COMMUNITY): Payer: Self-pay

## 2019-09-17 ENCOUNTER — Ambulatory Visit: Payer: Self-pay

## 2019-09-17 ENCOUNTER — Encounter: Payer: Self-pay | Admitting: Family

## 2019-09-17 ENCOUNTER — Ambulatory Visit (INDEPENDENT_AMBULATORY_CARE_PROVIDER_SITE_OTHER): Payer: Managed Care, Other (non HMO) | Admitting: Family

## 2019-09-17 VITALS — Ht 66.0 in | Wt 320.0 lb

## 2019-09-17 DIAGNOSIS — R202 Paresthesia of skin: Secondary | ICD-10-CM | POA: Diagnosis not present

## 2019-09-17 DIAGNOSIS — M25561 Pain in right knee: Secondary | ICD-10-CM

## 2019-09-17 DIAGNOSIS — R200 Anesthesia of skin: Secondary | ICD-10-CM

## 2019-09-17 DIAGNOSIS — M170 Bilateral primary osteoarthritis of knee: Secondary | ICD-10-CM

## 2019-09-17 DIAGNOSIS — G8929 Other chronic pain: Secondary | ICD-10-CM | POA: Diagnosis not present

## 2019-09-17 MED ORDER — LIDOCAINE HCL 1 % IJ SOLN
5.0000 mL | INTRAMUSCULAR | Status: AC | PRN
  Administered 2019-09-17: 5 mL

## 2019-09-17 MED ORDER — METHYLPREDNISOLONE ACETATE 40 MG/ML IJ SUSP
40.0000 mg | INTRAMUSCULAR | Status: AC | PRN
  Administered 2019-09-17: 40 mg via INTRA_ARTICULAR

## 2019-09-17 NOTE — Progress Notes (Signed)
Office Visit Note   Patient: [name]           Date of Birth: 12/08/1970           MRN: [id number] Visit Date: 09/17/2019              Requested by: No referring provider defined for this encounter. PCP: Patient, No Pcp Per  Chief Complaint  Patient presents with  . Left Knee - Pain  . Left Wrist - Pain      HPI: The patient is a 48 year old gentleman who presents today complaining of recurrence of his right knee pain.  Has had medial side pain with some mild swelling this comes and goes.  He is complaining of start up stiffness pain with ambulation has had some mechanical symptoms as well.  Has had good relief with Depo-Medrol injections in the past last injection was over a year ago  His second issue today is some numbness and pain in his left hand this is in the second through fourth fingers occasionally with a small finger no involvement of his thumb this is associated with pain aching no weakness he has had a history of carpal tunnel wonders if this is related.  He does have pain that extends from his elbow down to his ulnar to the hand states that he does sleep with his arms and wrist when he has lost his wrist splint is interested in getting a new one today  Is unable to get relief of his knee or hand symptoms with anti-inflammatories  Has history of carpal tunnel left wrist. Dr. August Saucerean released 3 years ago. States never felt it got much better.  Assessment & Plan: Visit Diagnoses:  1. Chronic pain of left knee   2. Bilateral primary osteoarthritis of knee   3. Numbness and tingling in left hand     Plan: We will provide a new wrist splint on the left feel this may be an ulnar nerve entrapment at the elbow discussed splinting of his elbow specialist sleep using anti-inflammatories.  Depo-Medrol injection of the right knee.  Patient tolerated well.  Follow-Up Instructions: Return if symptoms worsen or fail to improve.   Left Hand Exam   Tenderness  The patient  is experiencing no tenderness.   Range of Motion  The patient has normal left wrist ROM.  Muscle Strength  The patient has normal left wrist strength.  Tests  Phalen's Sign: negative Tinel's sign (median nerve): negative  Other  Erythema: absent      Patient is alert, oriented, no adenopathy, well-dressed, normal affect, normal respiratory effort.   Imaging: No results found. No images are attached to the encounter.  Labs: Lab Results  Component Value Date   HGBA1C 6.7 (H) 12/06/2017   HGBA1C (H) 11/11/2010    6.0 (NOTE)                                                                       According to the ADA Clinical Practice Recommendations for 2011, when HbA1c is used as a screening test:   >=6.5%   Diagnostic of Diabetes Mellitus           (if abnormal result  is confirmed)  5.7-6.4%  Increased risk of developing Diabetes Mellitus  References:Diagnosis and Classification of Diabetes Mellitus,Diabetes IOMB,5597,41(ULAGT 1):S62-S69 and Standards of Medical Care in         Diabetes - 2011,Diabetes XMIW,8032,12  (Suppl 1):S11-S61.   ESRSEDRATE 40 (H) 11/08/2017     Lab Results  Component Value Date   ALBUMIN 4.0 04/19/2019   ALBUMIN 4.0 10/10/2018   ALBUMIN 3.8 12/06/2017    No results found for: MG No results found for: VD25OH  No results found for: PREALBUMIN CBC EXTENDED Latest Ref Rng & Units 04/19/2019 01/28/2019 12/20/2018  WBC 4.0 - 10.5 K/uL 7.5 8.6 7.1  RBC 4.22 - 5.81 MIL/uL 5.20 5.43 5.15  HGB 13.0 - 17.0 g/dL 14.2 14.9 14.1  HCT 39.0 - 52.0 % 43.7 45.5 43.2  PLT 150 - 400 K/uL 309 335 338  NEUTROABS 1.7 - 7.7 K/uL 4.5 4.6 -  LYMPHSABS 0.7 - 4.0 K/uL 2.3 3.1 -     Body mass index is 51.65 kg/m.  Orders:  Orders Placed This Encounter  Procedures  . XR Knee 1-2 Views Right   No orders of the defined types were placed in this encounter.    Procedures: Large Joint Inj: R knee on 09/17/2019 3:51 PM Indications: pain Details: 18 G 1.5 in  needle, anteromedial approach Medications: 5 mL lidocaine 1 %; 40 mg methylPREDNISolone acetate 40 MG/ML Consent was given by the patient.      Clinical Data: No additional findings.  ROS:  All other systems negative, except as noted in the HPI. Review of Systems  Constitutional: Negative for chills and fever.  Musculoskeletal: Positive for arthralgias and myalgias.  Neurological: Positive for numbness. Negative for weakness.    Objective: Vital Signs: Ht 5\' 6"  (1.676 m)   Wt (!) 320 lb (145.2 kg)   BMI 51.65 kg/m   Specialty Comments:  No specialty comments available.  PMFS History: Patient Active Problem List   Diagnosis Date Noted  . Seasonal allergies 12/21/2018  . Plantar fasciitis 12/21/2018  . Right testicular pain 12/29/2016  . Class 3 severe obesity due to excess calories with body mass index (BMI) of 50.0 to 59.9 in adult ([place]) 11/24/2016  . Prediabetes 10/20/2016  . Obstructive sleep apnea of adult 10/20/2016  . OA (osteoarthritis) 10/20/2016  . HLD (hyperlipidemia) 10/20/2016  . Essential hypertension 10/20/2016  . Early stage glaucoma 10/20/2016  . Insomnia 08/26/2011  . Depression 08/26/2011  . Erectile dysfunction 08/19/2011  . Testicular/scrotal pain 08/09/2011  . Sinusitis 08/09/2011  . Pain in joints 03/03/2011  . Injury, other and unspecified, knee, leg, ankle, and foot 03/03/2011  . Gastro-esophageal reflux disease without esophagitis 12/16/2010  . Adjustment disorder with anxiety 12/16/2010  . Impaired fasting glucose 11/19/2010  . Morbid obesity ([place]) 11/19/2010   Past Medical History:  Diagnosis Date  . Anginal pain ([place])    04/2011 pt had agina, seen by cardiologist, no further treatment  . Arthritis   . Complication of anesthesia    " hard time due to sleep apnea - wtih colonoscopy in 2018   . GERD (gastroesophageal reflux disease)   . High cholesterol   . Hypertension   . Pneumonia    hx of   . Pre-diabetes   . Sleep apnea     cpap- does not know settings   . Swelling    in lower extremities     Family History  Problem Relation Age of Onset  . Diabetes Mother   . Hypertension Mother   . Stroke  Mother   . Hypertension Father   . Diabetes Father   . Lung cancer Father     Past Surgical History:  Procedure Laterality Date  . COLONOSCOPY  2018  . left carpal tunnel release      Social History   Occupational History  . 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 Smoking status: Never Smoker  . Smokeless tobacco: Never Used  Substance and Sexual Activity  . Alcohol use: No    Comment: 1-2 x per month   . Drug use: No  . Sexual activity: Not on file

## 2019-11-05 ENCOUNTER — Ambulatory Visit (INDEPENDENT_AMBULATORY_CARE_PROVIDER_SITE_OTHER): Payer: Managed Care, Other (non HMO) | Admitting: Family

## 2019-11-05 ENCOUNTER — Ambulatory Visit (INDEPENDENT_AMBULATORY_CARE_PROVIDER_SITE_OTHER): Payer: Managed Care, Other (non HMO)

## 2019-11-05 ENCOUNTER — Other Ambulatory Visit: Payer: Self-pay

## 2019-11-05 VITALS — Ht 66.0 in | Wt 304.0 lb

## 2019-11-05 DIAGNOSIS — M25552 Pain in left hip: Secondary | ICD-10-CM | POA: Diagnosis not present

## 2019-11-05 MED ORDER — PREDNISONE 10 MG PO TABS: 20.0000 mg | ORAL_TABLET | Freq: Every day | ORAL | 0 refills | Status: DC

## 2019-11-06 ENCOUNTER — Encounter: Payer: Self-pay | Admitting: Family

## 2019-11-06 NOTE — Progress Notes (Signed)
Office Visit Note   Patient: [name]           Date of Birth: 18-Sep-1971           MRN: [id number] Visit Date: 11/05/2019              Requested by: No referring provider defined for this encounter. PCP: Patient, No Pcp Per  Chief Complaint  Patient presents with  . Left Hip - Pain      HPI: This is a pleasant gentleman who has a 5-week history of left hip pain.  He denies any specific injury.  The pain hurts him both at rest and with ambulation.  He points to the groin but it also radiates around to his lower back and says he has shooting pain down his leg  Assessment & Plan: Visit Diagnoses:  1. Pain in left hip     Plan: I have placed him on a short course of steroid.  We will see how he does in the next 3 weeks.  He understands not to stop this medication suddenly and to take it with food.  If he has any increasing symptoms or pain he is to contact us immediately  Follow-Up Instructions: No follow-ups on file.   Ortho Exam  Patient is alert, oriented, no adenopathy, well-dressed, normal affect, normal respiratory effort. He has painful internal and external rotation of the hip his distal CMS is intact also pain with straight leg raise  Imaging: No results found. No images are attached to the encounter.  Labs: Lab Results  Component Value Date   HGBA1C 6.7 (H) 12/06/2017   HGBA1C (H) 11/11/2010    6.0 (NOTE)                                                                       According to the ADA Clinical Practice Recommendations for 2011, when HbA1c is used as a screening test:   >=6.5%   Diagnostic of Diabetes Mellitus           (if abnormal result  is confirmed)  5.7-6.4%   Increased risk of developing Diabetes Mellitus  References:Diagnosis and Classification of Diabetes Mellitus,Diabetes YJEH,6314,97(WYOVZ 1):S62-S69 and Standards of Medical Care in         Diabetes - 2011,Diabetes CHYI,5027,74  (Suppl 1):S11-S61.   ESRSEDRATE 40 (H) 11/08/2017      Lab Results  Component Value Date   ALBUMIN 4.0 04/19/2019   ALBUMIN 4.0 10/10/2018   ALBUMIN 3.8 12/06/2017    No results found for: MG No results found for: VD25OH  No results found for: PREALBUMIN CBC EXTENDED Latest Ref Rng & Units 04/19/2019 01/28/2019 12/20/2018  WBC 4.0 - 10.5 K/uL 7.5 8.6 7.1  RBC 4.22 - 5.81 MIL/uL 5.20 5.43 5.15  HGB 13.0 - 17.0 g/dL 14.2 14.9 14.1  HCT 39.0 - 52.0 % 43.7 45.5 43.2  PLT 150 - 400 K/uL 309 335 338  NEUTROABS 1.7 - 7.7 K/uL 4.5 4.6 -  LYMPHSABS 0.7 - 4.0 K/uL 2.3 3.1 -     Body mass index is 49.07 kg/m.  Orders:  Orders Placed This Encounter  Procedures  . XR HIP UNILAT W OR W/O PELVIS 1V LEFT   Meds ordered  this encounter  Medications  . predniSONE (DELTASONE) 10 MG tablet    Sig: Take 2 tablets (20 mg total) by mouth daily with breakfast.    Dispense:  50 tablet    Refill:  0     Procedures: No procedures performed  Clinical Data: No additional findings.  ROS:  All other systems negative, except as noted in the HPI. Review of Systems  Objective: Vital Signs: Ht 5\' 6"  (1.676 m)   Wt (!) 304 lb (137.9 kg)   BMI 49.07 kg/m   Specialty Comments:  No specialty comments available.  PMFS History: Patient Active Problem List   Diagnosis Date Noted  . Seasonal allergies 12/21/2018  . Plantar fasciitis 12/21/2018  . Right testicular pain 12/29/2016  . Class 3 severe obesity due to excess calories with body mass index (BMI) of 50.0 to 59.9 in adult (HCC) 11/24/2016  . Prediabetes 10/20/2016  . Obstructive sleep apnea of adult 10/20/2016  . OA (osteoarthritis) 10/20/2016  . HLD (hyperlipidemia) 10/20/2016  . Essential hypertension 10/20/2016  . Early stage glaucoma 10/20/2016  . Insomnia 08/26/2011  . Depression 08/26/2011  . Erectile dysfunction 08/19/2011  . Testicular/scrotal pain 08/09/2011  . Sinusitis 08/09/2011  . Pain in joints 03/03/2011  . Injury, other and unspecified, knee, leg, ankle, and foot  03/03/2011  . Gastro-esophageal reflux disease without esophagitis 12/16/2010  . Adjustment disorder with anxiety 12/16/2010  . Impaired fasting glucose 11/19/2010  . Morbid obesity (HCC) 11/19/2010   Past Medical History:  Diagnosis Date  . Anginal pain (HCC)    04/2011 pt had agina, seen by cardiologist, no further treatment  . Arthritis   . Complication of anesthesia    " hard time due to sleep apnea - wtih colonoscopy in 2018   . GERD (gastroesophageal reflux disease)   . High cholesterol   . Hypertension   . Pneumonia    hx of   . Pre-diabetes   . Sleep apnea    cpap- does not know settings   . Swelling    in lower extremities     Family History  Problem Relation Age of Onset  . Diabetes Mother   . Hypertension Mother   . Stroke Mother   . Hypertension Father   . Diabetes Father   . Lung cancer Father     Past Surgical History:  Procedure Laterality Date  . COLONOSCOPY  2018  . left carpal tunnel release      Social History   Occupational History  . 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 Smoking status: Never Smoker  . Smokeless tobacco: Never Used  Substance and Sexual Activity  . Alcohol use: No    Comment: 1-2 x per month   . Drug use: No  . Sexual activity: Not on file

## 2020-02-06 NOTE — Progress Notes (Deleted)
Cardiology Office Note   Date:  02/06/2020   ID:  [name], DOB [birth year], MRN [id number]  PCP:  Patient, No Pcp Per  Cardiologist:   Monty Spicher [place], MD   No chief complaint on file.     History of Present Illness: [name] is a 49 y.o. male who presents for is seen at the request of Lennice Sites DO for evaluation of chest pain. He was seen 10/10/18 in the ED with chest pain. Troponin levels were normal. CT chest showed no evidence of PE. There was aortic calcification. No coronary calcification. Ecg showed nonspecific T wave abnormality. He was also seen in ED for atypical chest pain in March and June 2020 without acute findings.   On follow up today he is doing well. He states he was having pain in his back that radiated to his chest with some pressure. This has resolved. He has lost 50 lbs since June. Doesn't always wear his CPAP. BP generally is well controlled but does go up when he is anxious or upset. He walk a lot at work. 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 Prior primary care was in [facility]. Now working in [employer] and needs to get new primary care.     Past Medical History:  Diagnosis Date  . Anginal pain ([place])    04/2011 pt had agina, seen by cardiologist, no further treatment  . Arthritis   . Complication of anesthesia    " hard time due to sleep apnea - wtih colonoscopy in 2018   . GERD (gastroesophageal reflux disease)   . High cholesterol   . Hypertension   . Pneumonia    hx of   . Pre-diabetes   . Sleep apnea    cpap- does not know settings   . Swelling    in lower extremities     Past Surgical History:  Procedure Laterality Date  . COLONOSCOPY  2018  . left carpal tunnel release        Current Outpatient Medications  Medication Sig Dispense Refill  . amLODipine (NORVASC) 5 MG tablet Take 5 mg by mouth daily.  3  . aspirin EC 81 MG tablet Take 81 mg by mouth every morning.    Marland Kitchen atorvastatin (LIPITOR) 40 MG tablet Take 40 mg  by mouth daily.   3  . hydrochlorothiazide (HYDRODIURIL) 25 MG tablet Take 25 mg by mouth daily.   2  . HYDROcodone-acetaminophen (NORCO/VICODIN) 5-325 MG tablet Take 1 tablet by mouth every 6 (six) hours as needed for moderate pain. 15 tablet 0  . omeprazole (PRILOSEC) 20 MG capsule Take 20 mg by mouth daily.    . predniSONE (DELTASONE) 10 MG tablet Take 2 tablets (20 mg total) by mouth daily with breakfast. 50 tablet 0   No current facility-administered medications for this visit.    Allergies:   Reglan [metoclopramide]    Social History:  The patient  reports that he has never smoked. He has never used smokeless tobacco. He reports that he does not drink alcohol or use drugs.   Family History:  The patient's family history includes Diabetes in his father and mother; Hypertension in his father and mother; Lung cancer in his father; Stroke in his mother.    ROS:  Please see the history of present illness.   Otherwise, review of systems are positive for none.   All other systems are reviewed and negative.    PHYSICAL EXAM: VS:  There were  no vitals taken for this visit. , BMI There is no height or weight on file to calculate BMI. GEN: Well nourished, obese BM in no acute distress  HEENT: normal  Neck: no JVD, carotid bruits, or masses Cardiac: RRR; no murmurs, rubs, or gallops,no edema  Respiratory:  clear to auscultation bilaterally, normal work of breathing GI: soft, nontender, nondistended, + BS MS: no deformity or atrophy  Skin: warm and dry, no rash Neuro:  Strength and sensation are intact Psych: euthymic mood, full affect   EKG:  EKG is ordered today. The ekg ordered today demonstrates NSR with nonspecific TWA. I have personally reviewed and interpreted this study.    Recent Labs: 04/19/2019: ALT 25; BUN 12; Creatinine, Ser 0.98; Hemoglobin 14.2; Platelets 309; Potassium 3.7; Sodium 137    Lipid Panel    Component Value Date/Time   CHOL 108 06/05/2018 0812   TRIG  85 06/05/2018 0812   HDL 27 (L) 06/05/2018 0812   CHOLHDL 4.0 06/05/2018 0812   LDLCALC 64 06/05/2018 0812      Wt Readings from Last 3 Encounters:  11/05/19 (!) 304 lb (137.9 kg)  09/17/19 (!) 320 lb (145.2 kg)  04/19/19 (!) 320 lb (145.2 kg)      Other studies Reviewed: Additional studies/ records that were reviewed today include: reviewed personally CT scan.    ASSESSMENT AND PLAN:  1.  Atypical chest pain. Risk factors of obesity, HTN, HLD. CT scan reviewed and there is NO coronary calcification and visualized portions of the coronary tree look normal. I am underwhelmed by any aortic atherosclerosis. I would recommend continued risk factor modification. I don't feel any additional cardiac evaluation is warranted 2. HTN well controlled 3. HLD LDL is at goal. 4. Obesity with OSA. Continue CPAP nightly. Continue to focus on weight loss and increase aerobic activity.  I have encouraged him to get established with primary care. I will see back as needed.   Current medicines are reviewed at length with the patient today.  The patient does not have concerns regarding medicines.  The following changes have been made:  no change  Labs/ tests ordered today include:  No orders of the defined types were placed in this encounter.    Disposition:   FU PRN   Signed, Trixie Maclaren [place], MD  02/06/2020 8:23 AM    [facility] [address], [place], [place], [address] Phone [phone], Fax [phone]

## 2020-02-07 ENCOUNTER — Ambulatory Visit: Payer: Managed Care, Other (non HMO) | Admitting: Cardiology

## 2020-02-11 NOTE — Progress Notes (Deleted)
Cardiology Office Note   Date:  02/11/2020   ID:  [name], DOB June 04, 1971, MRN [id number]  PCP:  Patient, No Pcp Per  Cardiologist:   Jaella Weinert [place], MD   No chief complaint on file.     History of Present Illness: [name] is a 49 y.o. male who presents for is seen at the request of [name] DO for evaluation of chest pain. He was seen 10/10/18 in the ED with chest pain. Troponin levels were normal. CT chest showed no evidence of PE. There was aortic calcification. No coronary calcification. Ecg showed nonspecific T wave abnormality. He was also seen in ED for atypical chest pain in March and June 2020 without acute findings.   On follow up today he is doing well. He states he was having pain in his back that radiated to his chest with some pressure. This has resolved. He has lost 50 lbs since June. Doesn't always wear his CPAP. BP generally is well controlled but does go up when he is anxious or upset. He walk a lot at work. 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 Prior primary care was in [facility]. Now working in [employer] and needs to get new primary care.     Past Medical History:  Diagnosis Date  . Anginal pain (HCC)    04/2011 pt had agina, seen by cardiologist, no further treatment  . Arthritis   . Complication of anesthesia    " hard time due to sleep apnea - wtih colonoscopy in 2018   . GERD (gastroesophageal reflux disease)   . High cholesterol   . Hypertension   . Pneumonia    hx of   . Pre-diabetes   . Sleep apnea    cpap- does not know settings   . Swelling    in lower extremities     Past Surgical History:  Procedure Laterality Date  . COLONOSCOPY  2018  . left carpal tunnel release        Current Outpatient Medications  Medication Sig Dispense Refill  . amLODipine (NORVASC) 5 MG tablet Take 5 mg by mouth daily.  3  . aspirin EC 81 MG tablet Take 81 mg by mouth every morning.    Marland Kitchen atorvastatin (LIPITOR) 40 MG tablet Take 40 mg  by mouth daily.   3  . hydrochlorothiazide (HYDRODIURIL) 25 MG tablet Take 25 mg by mouth daily.   2  . HYDROcodone-acetaminophen (NORCO/VICODIN) 5-325 MG tablet Take 1 tablet by mouth every 6 (six) hours as needed for moderate pain. 15 tablet 0  . omeprazole (PRILOSEC) 20 MG capsule Take 20 mg by mouth daily.    . predniSONE (DELTASONE) 10 MG tablet Take 2 tablets (20 mg total) by mouth daily with breakfast. 50 tablet 0   No current facility-administered medications for this visit.    Allergies:   Reglan [metoclopramide]    Social History:  The patient  reports that he has never smoked. He has never used smokeless tobacco. He reports that he does not drink alcohol or use drugs.   Family History:  The patient's family history includes Diabetes in his father and mother; Hypertension in his father and mother; Lung cancer in his father; Stroke in his mother.    ROS:  Please see the history of present illness.   Otherwise, review of systems are positive for none.   All other systems are reviewed and negative.    PHYSICAL EXAM: VS:  There were  no vitals taken for this visit. , BMI There is no height or weight on file to calculate BMI. GEN: Well nourished, obese BM in no acute distress  HEENT: normal  Neck: no JVD, carotid bruits, or masses Cardiac: RRR; no murmurs, rubs, or gallops,no edema  Respiratory:  clear to auscultation bilaterally, normal work of breathing GI: soft, nontender, nondistended, + BS MS: no deformity or atrophy  Skin: warm and dry, no rash Neuro:  Strength and sensation are intact Psych: euthymic mood, full affect   EKG:  EKG is ordered today. The ekg ordered today demonstrates NSR with nonspecific TWA. I have personally reviewed and interpreted this study.    Recent Labs: 04/19/2019: ALT 25; BUN 12; Creatinine, Ser 0.98; Hemoglobin 14.2; Platelets 309; Potassium 3.7; Sodium 137    Lipid Panel    Component Value Date/Time   CHOL 108 06/05/2018 0812   TRIG  85 06/05/2018 0812   HDL 27 (L) 06/05/2018 0812   CHOLHDL 4.0 06/05/2018 0812   LDLCALC 64 06/05/2018 0812      Wt Readings from Last 3 Encounters:  11/05/19 (!) 304 lb (137.9 kg)  09/17/19 (!) 320 lb (145.2 kg)  04/19/19 (!) 320 lb (145.2 kg)      Other studies Reviewed: Additional studies/ records that were reviewed today include: reviewed personally CT scan.    ASSESSMENT AND PLAN:  1.  Atypical chest pain. Risk factors of obesity, HTN, HLD. CT scan reviewed and there is NO coronary calcification and visualized portions of the coronary tree look normal. I am underwhelmed by any aortic atherosclerosis. I would recommend continued risk factor modification. I don't feel any additional cardiac evaluation is warranted 2. HTN well controlled 3. HLD LDL is at goal. 4. Obesity with OSA. Continue CPAP nightly. Continue to focus on weight loss and increase aerobic activity.  I have encouraged him to get established with primary care. I will see back as needed.   Current medicines are reviewed at length with the patient today.  The patient does not have concerns regarding medicines.  The following changes have been made:  no change  Labs/ tests ordered today include:  No orders of the defined types were placed in this encounter.    Disposition:   FU PRN   Signed, Taiga Lupinacci [place], MD  02/11/2020 4:54 PM    [facility] [address], [place], [place], [address] Phone [phone], Fax [phone]

## 2020-02-13 ENCOUNTER — Ambulatory Visit: Payer: Managed Care, Other (non HMO) | Admitting: Cardiology

## 2020-02-25 ENCOUNTER — Telehealth: Payer: Self-pay | Admitting: *Deleted

## 2020-02-25 ENCOUNTER — Telehealth: Payer: Managed Care, Other (non HMO) | Admitting: Cardiology

## 2020-02-25 NOTE — Telephone Encounter (Signed)
Multiple attempts to contact pt and lvm about appointment today  

## 2020-02-28 ENCOUNTER — Ambulatory Visit: Payer: Managed Care, Other (non HMO) | Admitting: Family

## 2020-03-04 ENCOUNTER — Other Ambulatory Visit: Payer: Self-pay

## 2020-03-04 ENCOUNTER — Encounter: Payer: Self-pay | Admitting: Family

## 2020-03-04 ENCOUNTER — Ambulatory Visit (INDEPENDENT_AMBULATORY_CARE_PROVIDER_SITE_OTHER): Payer: Managed Care, Other (non HMO) | Admitting: Family

## 2020-03-04 DIAGNOSIS — M170 Bilateral primary osteoarthritis of knee: Secondary | ICD-10-CM | POA: Diagnosis not present

## 2020-03-04 MED ORDER — LIDOCAINE HCL 1 % IJ SOLN
5.0000 mL | INTRAMUSCULAR | Status: AC | PRN
  Administered 2020-03-04: 5 mL

## 2020-03-04 MED ORDER — METHYLPREDNISOLONE ACETATE 40 MG/ML IJ SUSP
40.0000 mg | INTRAMUSCULAR | Status: AC | PRN
  Administered 2020-03-04: 40 mg via INTRA_ARTICULAR

## 2020-03-04 NOTE — Progress Notes (Signed)
Office Visit Note   Patient: [name]           Date of Birth: Dec 03, 1970           MRN: [id number] Visit Date: 03/04/2020              Requested by: No referring provider defined for this encounter. PCP: Patient, No Pcp Per  No chief complaint on file.     HPI: The patient is a 49 year old gentleman who presents today complaining of bilateral knee pain.  This is a chronic problem for him.  He is been having worse pain pain with ambulation prolonged standing he states he will be getting in new job which will require him to stand for prolonged periods of time he is concerned about this.  Currently he is even having knee pain at rest occasionally when he is lying in bed.  He is having a deep global knee pain.  Complains of popping and crepitation.  No giving way.  Intermittent swelling.  If he has increased his activities reports his knees will swell  Assessment & Plan: Visit Diagnoses:  1. Bilateral primary osteoarthritis of knee     Plan: Bilateral Depo-Medrol injections of his knees.  He tolerated this well.  We will follow-up in the office as needed  Follow-Up Instructions: Return if symptoms worsen or fail to improve.   Right Knee Exam   Tenderness  The patient is experiencing no tenderness.   Range of Motion  The patient has normal right knee ROM.  Tests  Varus: negative Valgus: negative  Other  Erythema: absent Swelling: mild Effusion: no effusion present   Left Knee Exam   Tenderness  The patient is experiencing no tenderness.   Range of Motion  The patient has normal left knee ROM.  Tests  Varus: negative Valgus: negative  Other  Erythema: absent Swelling: mild Effusion: no effusion present      Patient is alert, oriented, no adenopathy, well-dressed, normal affect, normal respiratory effort. Crepitation b knees.   Imaging: No results found. No images are attached to the encounter.  Labs: Lab Results  Component Value Date    HGBA1C 6.7 (H) 12/06/2017   HGBA1C (H) 11/11/2010    6.0 (NOTE)                                                                       According to the ADA Clinical Practice Recommendations for 2011, when HbA1c is used as a screening test:   >=6.5%   Diagnostic of Diabetes Mellitus           (if abnormal result  is confirmed)  5.7-6.4%   Increased risk of developing Diabetes Mellitus  References:Diagnosis and Classification of Diabetes Mellitus,Diabetes YIRS,8546,27(OJJKK 1):S62-S69 and Standards of Medical Care in         Diabetes - 2011,Diabetes XFGH,8299,37  (Suppl 1):S11-S61.   ESRSEDRATE 40 (H) 11/08/2017     Lab Results  Component Value Date   ALBUMIN 4.0 04/19/2019   ALBUMIN 4.0 10/10/2018   ALBUMIN 3.8 12/06/2017    No results found for: MG No results found for: VD25OH  No results found for: PREALBUMIN CBC EXTENDED Latest Ref Rng & Units 04/19/2019 01/28/2019 12/20/2018  WBC  4.0 - 10.5 K/uL 7.5 8.6 7.1  RBC 4.22 - 5.81 MIL/uL 5.20 5.43 5.15  HGB 13.0 - 17.0 g/dL 10.9 32.3 55.7  HCT 32.2 - 52.0 % 43.7 45.5 43.2  PLT 150 - 400 K/uL 309 335 338  NEUTROABS 1.7 - 7.7 K/uL 4.5 4.6 -  LYMPHSABS 0.7 - 4.0 K/uL 2.3 3.1 -     There is no height or weight on file to calculate BMI.  Orders:  No orders of the defined types were placed in this encounter.  No orders of the defined types were placed in this encounter.    Procedures: Large Joint Inj: bilateral knee on 03/04/2020 2:12 PM Indications: pain Details: 18 G 1.5 in needle, anteromedial approach Medications (Right): 5 mL lidocaine 1 %; 40 mg methylPREDNISolone acetate 40 MG/ML Medications (Left): 5 mL lidocaine 1 %; 40 mg methylPREDNISolone acetate 40 MG/ML Consent was given by the patient.      Clinical Data: No additional findings.  ROS:  All other systems negative, except as noted in the HPI. Review of Systems  Objective: Vital Signs: There were no vitals taken for this visit.  Specialty Comments:  No  specialty comments available.  PMFS History: Patient Active Problem List   Diagnosis Date Noted  . Seasonal allergies 12/21/2018  . Plantar fasciitis 12/21/2018  . Right testicular pain 12/29/2016  . Class 3 severe obesity due to excess calories with body mass index (BMI) of 50.0 to 59.9 in adult (HCC) 11/24/2016  . Prediabetes 10/20/2016  . Obstructive sleep apnea of adult 10/20/2016  . OA (osteoarthritis) 10/20/2016  . HLD (hyperlipidemia) 10/20/2016  . Essential hypertension 10/20/2016  . Early stage glaucoma 10/20/2016  . Insomnia 08/26/2011  . Depression 08/26/2011  . Erectile dysfunction 08/19/2011  . Testicular/scrotal pain 08/09/2011  . Sinusitis 08/09/2011  . Pain in joints 03/03/2011  . Injury, other and unspecified, knee, leg, ankle, and foot 03/03/2011  . Gastro-esophageal reflux disease without esophagitis 12/16/2010  . Adjustment disorder with anxiety 12/16/2010  . Impaired fasting glucose 11/19/2010  . Morbid obesity (HCC) 11/19/2010   Past Medical History:  Diagnosis Date  . Anginal pain (HCC)    04/2011 pt had agina, seen by cardiologist, no further treatment  . Arthritis   . Complication of anesthesia    " hard time due to sleep apnea - wtih colonoscopy in 2018   . GERD (gastroesophageal reflux disease)   . High cholesterol   . Hypertension   . Pneumonia    hx of   . Pre-diabetes   . Sleep apnea    cpap- does not know settings   . Swelling    in lower extremities     Family History  Problem Relation Age of Onset  . Diabetes Mother   . Hypertension Mother   . Stroke Mother   . Hypertension Father   . Diabetes Father   . Lung cancer Father     Past Surgical History:  Procedure Laterality Date  . COLONOSCOPY  2018  . left carpal tunnel release      Social History   Occupational History  . 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 Smoking status: Never Smoker  . Smokeless tobacco: Never Used    Substance and Sexual Activity  . Alcohol use: No    Comment: 1-2 x per month   . Drug use: No  . Sexual activity: Not on file

## 2020-06-18 ENCOUNTER — Encounter: Payer: Self-pay | Admitting: Family

## 2020-06-19 ENCOUNTER — Telehealth: Payer: Self-pay

## 2020-06-19 ENCOUNTER — Other Ambulatory Visit: Payer: Self-pay | Admitting: Family

## 2020-06-19 MED ORDER — PREDNISONE 10 MG PO TABS: 20.0000 mg | ORAL_TABLET | Freq: Every day | ORAL | 0 refills | Status: DC

## 2020-06-19 NOTE — Telephone Encounter (Signed)
I called pt and he wants a handicap parking application. I have one ready for signature ok per Erin;s last note. Pt will hve his wife pick up on Monday at the front desk.

## 2020-06-19 NOTE — Telephone Encounter (Signed)
Patient LMVM of triage phone-it was difficult to understand what he was asking for. He mentioned needing an email that he could submit his paperwork to? Then he mentioned the need for handicap placard? He did not leave a call back number.

## 2020-08-06 IMAGING — CT CT ANGIO CHEST
2 of 7 series · 18 of 46 positions shown · IV contrast (iopamidol)
Comparison: None

CLINICAL DATA: Shortness of breath and chest pain.

EXAM:
CT ANGIOGRAPHY CHEST WITH CONTRAST
TECHNIQUE: Multidetector CT imaging of the chest was performed using the
standard protocol during bolus administration of intravenous
contrast. Multiplanar CT image reconstructions and MIPs were
obtained to evaluate the vascular anatomy.
CONTRAST:  <See Chart> 5IINPG-M8B IOPAMIDOL (5IINPG-M8B) INJECTION
76%

[Series 7: thins · axial · 0.90mm/px · z∈[+1041,+1260]mm · 15 of 353 slices shown]
[im 20/353  lung]
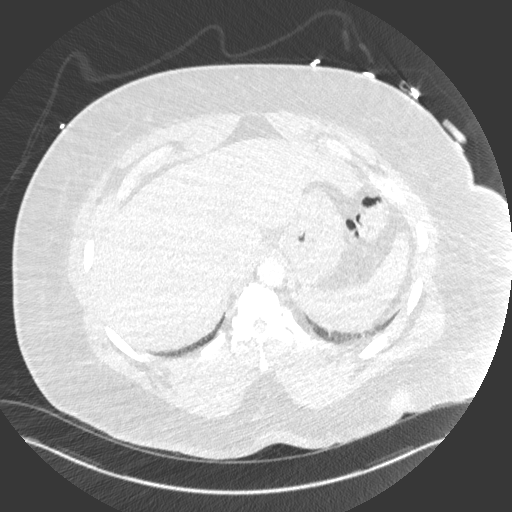
[im 40/353  soft-tissue]
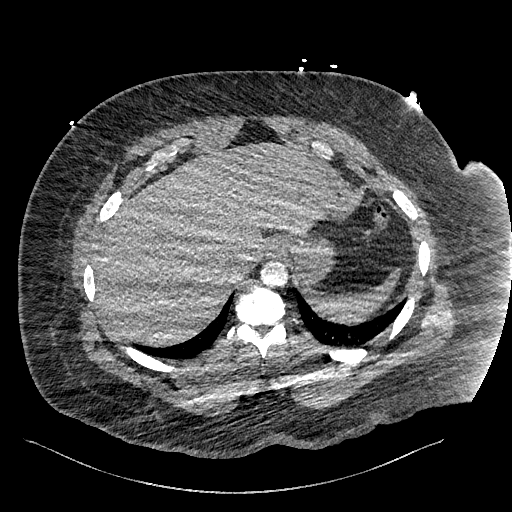
[im 59/353  lung]
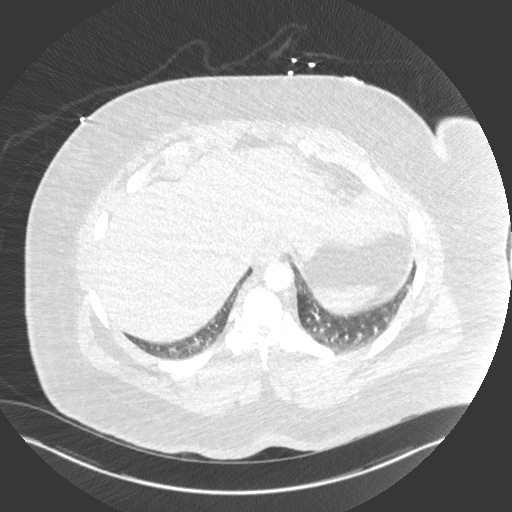
[im 79/353  soft-tissue]
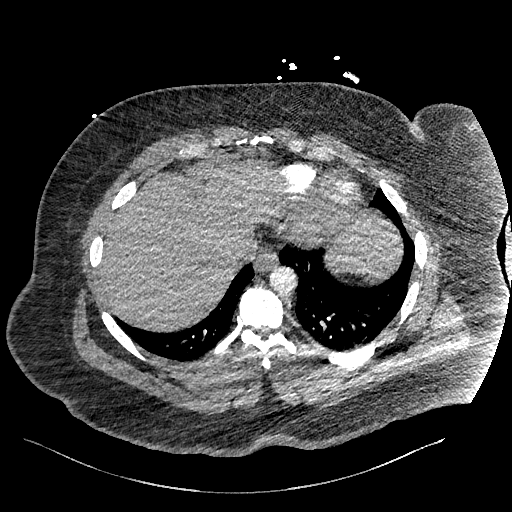
[im 118/353  lung]
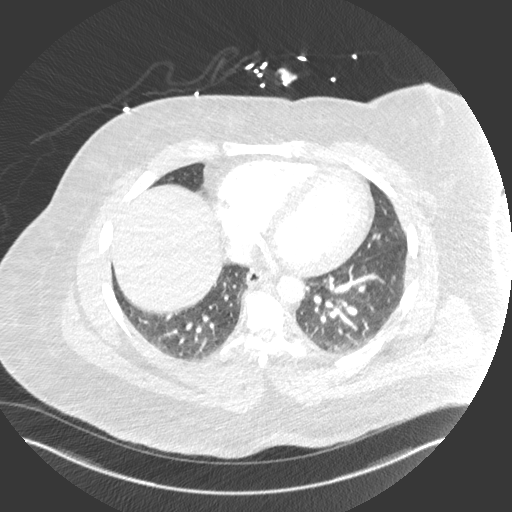
[im 137/353  soft-tissue]
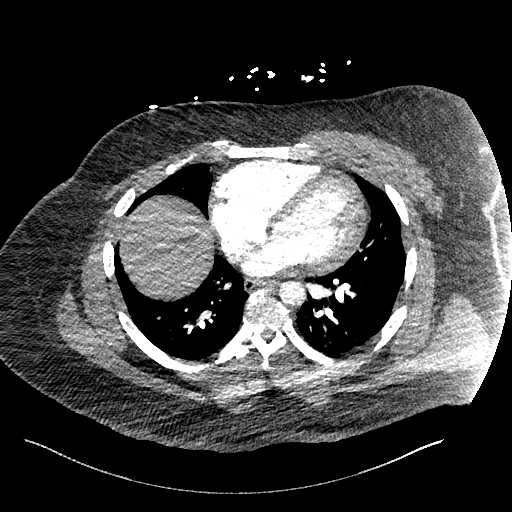
[im 157/353  lung]
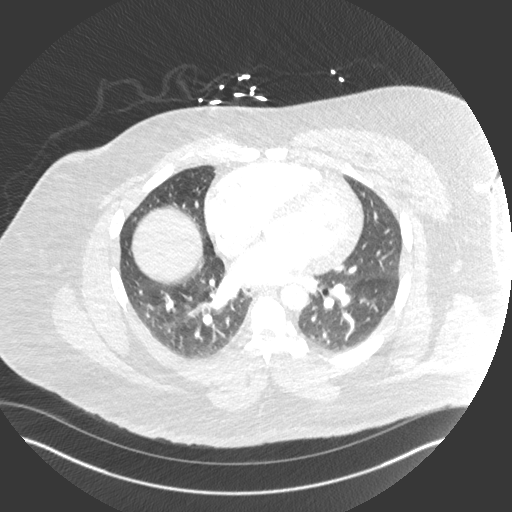
[im 177/353  soft-tissue]
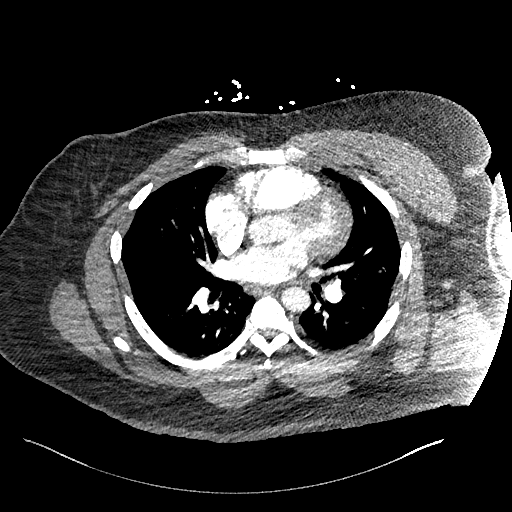
[im 196/353  lung]
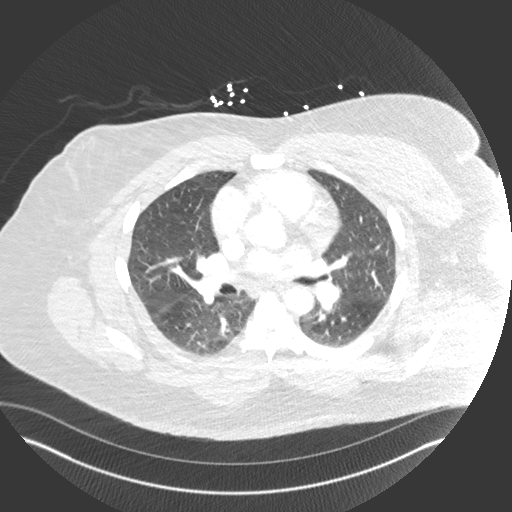
[im 216/353  soft-tissue]
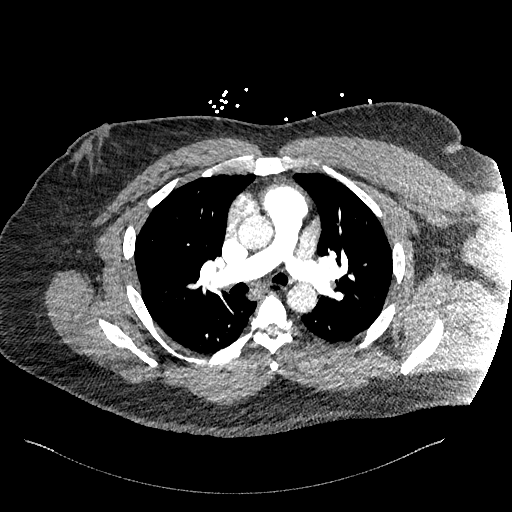
[im 235/353  lung]
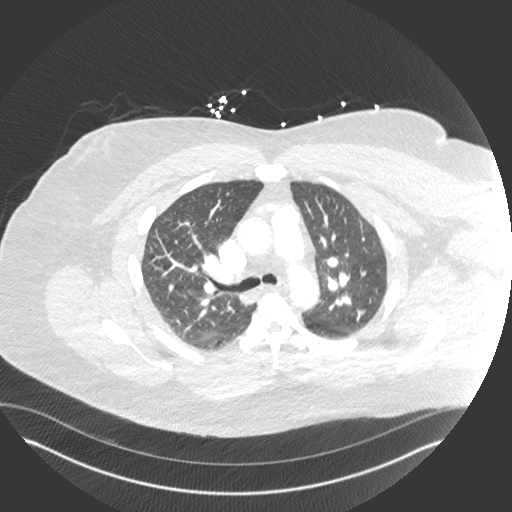
[im 274/353  soft-tissue]
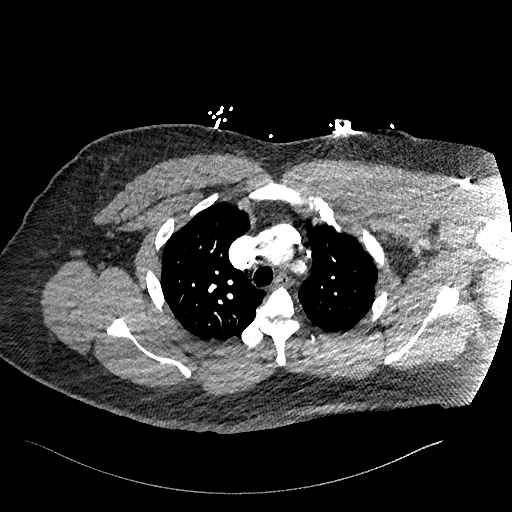
[im 294/353  lung]
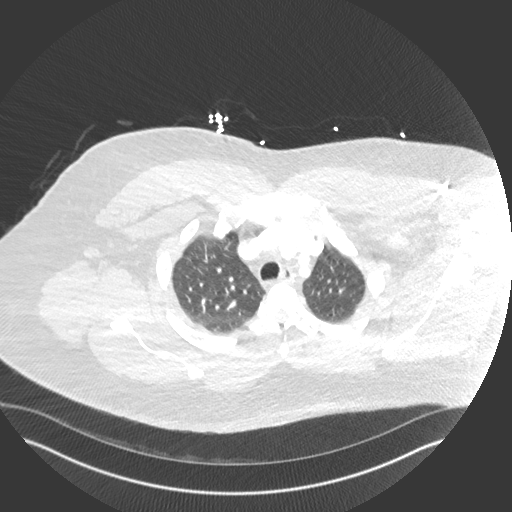
[im 313/353  soft-tissue]
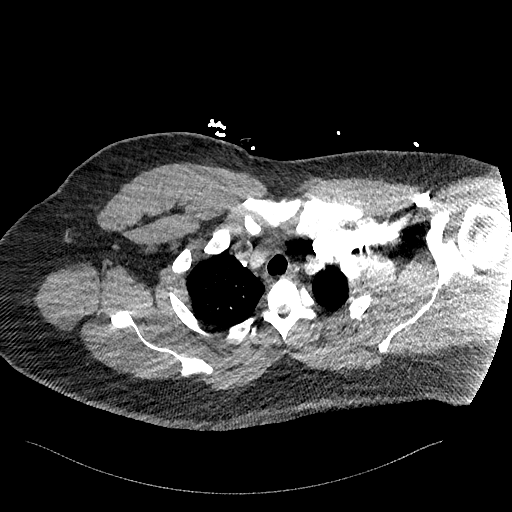
[im 333/353  lung]
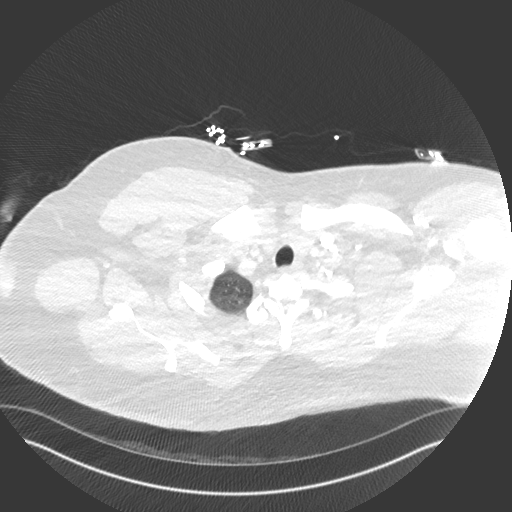

[Series 8: cor · coronal · 0.56mm/px · 3 of 136 slices shown]
[im 34/136  soft-tissue]
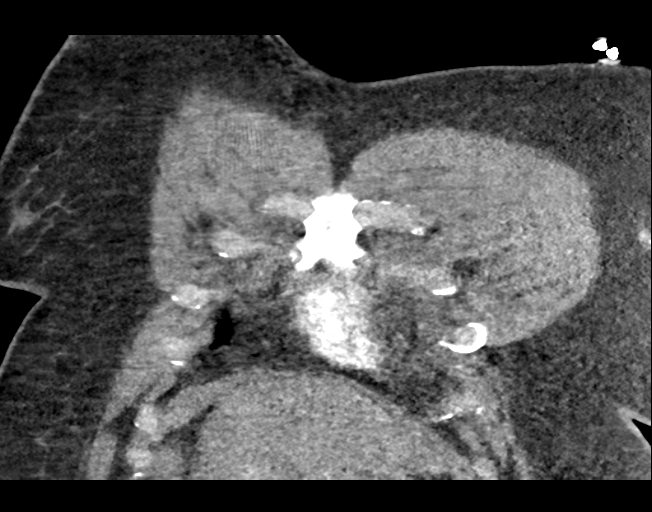
[im 68/136  soft-tissue]
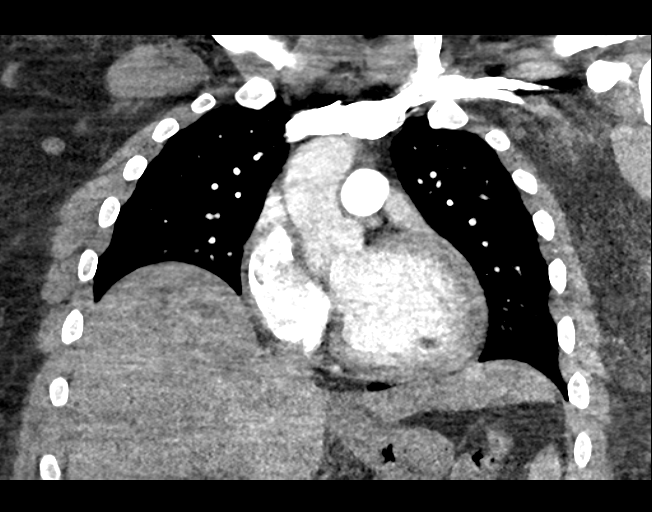
[im 102/136  soft-tissue]
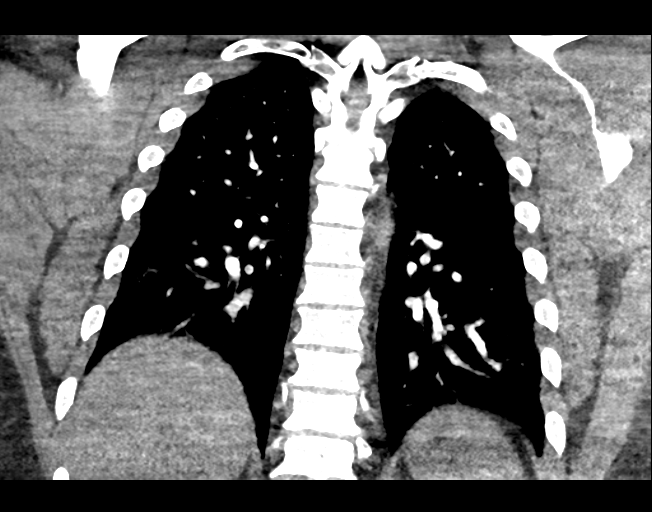

[18 of 46 positions shown; findings below may reference images not displayed]

FINDINGS: Cardiovascular: Heart size is normal. No pericardial effusion.
Aortic atherosclerosis identified. Preferential opacification of the
main pulmonary artery and its branches identified. No central
obstructing pulmonary embolus identified. There is no lobar or
segmental pulmonary artery filling defects identified to suggest a
clinically significant acute pulmonary embolus.

Mediastinum/Nodes: No enlarged mediastinal, hilar, or axillary lymph
nodes. Thyroid gland, trachea, and esophagus demonstrate no
significant findings.

Lungs/Pleura: There is no pleural effusion, airspace consolidation,
or atelectasis identified.

Upper Abdomen: No acute abnormality.

Musculoskeletal: No chest wall abnormality. No acute or significant
osseous findings.

Review of the MIP images confirms the above findings.
IMPRESSION: 1. No evidence for acute pulmonary embolus. No acute cardiopulmonary
abnormalities identified.
2.  Aortic Atherosclerosis ([id number]).

## 2020-11-23 ENCOUNTER — Other Ambulatory Visit: Payer: Self-pay

## 2021-01-05 MED ORDER — PREDNISONE 10 MG PO TABS: 20.0000 mg | ORAL_TABLET | Freq: Every day | ORAL | 0 refills | Status: DC

## 2021-03-12 ENCOUNTER — Ambulatory Visit (INDEPENDENT_AMBULATORY_CARE_PROVIDER_SITE_OTHER): Payer: Medicaid Other | Admitting: Family

## 2021-03-12 ENCOUNTER — Encounter: Payer: Self-pay | Admitting: Family

## 2021-03-12 ENCOUNTER — Ambulatory Visit (INDEPENDENT_AMBULATORY_CARE_PROVIDER_SITE_OTHER): Payer: Medicaid Other

## 2021-03-12 DIAGNOSIS — M170 Bilateral primary osteoarthritis of knee: Secondary | ICD-10-CM | POA: Diagnosis not present

## 2021-03-12 DIAGNOSIS — M25512 Pain in left shoulder: Secondary | ICD-10-CM

## 2021-03-12 NOTE — Progress Notes (Signed)
Office Visit Note   Patient: [name]           Date of Birth: 1971-09-25           MRN: [id number] Visit Date: 03/12/2021              Requested by: No referring provider defined for this encounter. PCP: Patient, No Pcp Per (Inactive)  Chief Complaint  Patient presents with  . Left Knee - Pain  . Right Knee - Pain  . Left Shoulder - Pain      HPI: The patient is a 50 year old gentleman seen today for 2 separate issues.   1. Bilateral knee pain History of chronic osteoarthritis with chronic pain.  Worse pain with prolonged standing or ambulation.  Does have to stand for work.  Complains of popping crepitation no giving way no mechanical symptoms he last had Depo-Medrol injections of both knees in May of last year he reports great interval relief with Depo-Medrol injections.  Requesting repeat injection today.  2. Left shoulder pain Acute onset of left shoulder pain about 3 weeks ago he was lifting when he heard a pop in his left shoulder has had intense pain in the front of his shoulder since pain with range of motion unable to lift above his head or reach behind his back he complains of some loss of strength in his left upper extremity  Assessment & Plan: Visit Diagnoses:  1. Acute pain of left shoulder     Plan: Depo-Medrol injection left shoulder bilateral knees today.  Patient tolerated well.  Concern for partial tearing of his left RC. Will reevaluate in 3-4 weeks.   Follow-Up Instructions: No follow-ups on file.   Right Knee Exam   Muscle Strength  The patient has normal right knee strength.  Tenderness  The patient is experiencing tenderness in the medial joint line.  Tests  Varus: negative Valgus: negative  Other  Swelling: none   Left Knee Exam   Muscle Strength  The patient has normal left knee strength.  Tenderness  The patient is experiencing tenderness in the medial joint line.  Tests  Varus: negative Valgus: negative  Other   Swelling: none   Left Shoulder Exam   Tenderness  The patient is experiencing tenderness in the biceps tendon.  Range of Motion  Passive abduction: normal   Tests  Impingement: positive Drop arm: negative      Patient is alert, oriented, no adenopathy, well-dressed, normal affect, normal respiratory effort.   Imaging: No results found. No images are attached to the encounter.  Labs: Lab Results  Component Value Date   HGBA1C 6.7 (H) 12/06/2017   HGBA1C (H) 11/11/2010    6.0 (NOTE)                                                                       According to the ADA Clinical Practice Recommendations for 2011, when HbA1c is used as a screening test:   >=6.5%   Diagnostic of Diabetes Mellitus           (if abnormal result  is confirmed)  5.7-6.4%   Increased risk of developing Diabetes Mellitus  References:Diagnosis and Classification of Diabetes Mellitus,Diabetes Care,2011,34(Suppl 1):S62-S69 and  Standards of Medical Care in         Diabetes - 2011,Diabetes Care,2011,34  (Suppl 1):S11-S61.   ESRSEDRATE 40 (H) 11/08/2017     Lab Results  Component Value Date   ALBUMIN 4.0 04/19/2019   ALBUMIN 4.0 10/10/2018   ALBUMIN 3.8 12/06/2017    No results found for: MG No results found for: VD25OH  No results found for: PREALBUMIN CBC EXTENDED Latest Ref Rng & Units 04/19/2019 01/28/2019 12/20/2018  WBC 4.0 - 10.5 K/uL 7.5 8.6 7.1  RBC 4.22 - 5.81 MIL/uL 5.20 5.43 5.15  HGB 13.0 - 17.0 g/dL 18.8 41.6 60.6  HCT 30.1 - 52.0 % 43.7 45.5 43.2  PLT 150 - 400 K/uL 309 335 338  NEUTROABS 1.7 - 7.7 K/uL 4.5 4.6 -  LYMPHSABS 0.7 - 4.0 K/uL 2.3 3.1 -     There is no height or weight on file to calculate BMI.  Orders:  Orders Placed This Encounter  Procedures  . XR Shoulder Left   No orders of the defined types were placed in this encounter.    Procedures: Large Joint Inj: L subacromial bursa on 03/12/2021 11:23 AM Indications: pain Details: 22 G 1.5 in  needle Medications: 5 mL lidocaine 1 %; 40 mg methylPREDNISolone acetate 40 MG/ML Consent was given by the patient.   Large Joint Inj: bilateral knee on 03/12/2021 11:23 AM Indications: pain Details: 18 G 1.5 in needle, anteromedial approach Medications (Right): 5 mL lidocaine 1 %; 40 mg methylPREDNISolone acetate 40 MG/ML Medications (Left): 5 mL lidocaine 1 %; 40 mg methylPREDNISolone acetate 40 MG/ML Consent was given by the patient.      Clinical Data: No additional findings.  ROS:  All other systems negative, except as noted in the HPI. Review of Systems  Constitutional: Negative for chills and fever.  Musculoskeletal: Positive for arthralgias and gait problem. Negative for joint swelling.  Neurological: Positive for weakness. Negative for numbness.    Objective: Vital Signs: There were no vitals taken for this visit.  Specialty Comments:  No specialty comments available.  PMFS History: Patient Active Problem List   Diagnosis Date Noted  . Seasonal allergies 12/21/2018  . Plantar fasciitis 12/21/2018  . Right testicular pain 12/29/2016  . Class 3 severe obesity due to excess calories with body mass index (BMI) of 50.0 to 59.9 in adult (HCC) 11/24/2016  . Prediabetes 10/20/2016  . Obstructive sleep apnea of adult 10/20/2016  . OA (osteoarthritis) 10/20/2016  . HLD (hyperlipidemia) 10/20/2016  . Essential hypertension 10/20/2016  . Early stage glaucoma 10/20/2016  . Insomnia 08/26/2011  . Depression 08/26/2011  . Erectile dysfunction 08/19/2011  . Testicular/scrotal pain 08/09/2011  . Sinusitis 08/09/2011  . Pain in joints 03/03/2011  . Injury, other and unspecified, knee, leg, ankle, and foot 03/03/2011  . Gastro-esophageal reflux disease without esophagitis 12/16/2010  . Adjustment disorder with anxiety 12/16/2010  . Impaired fasting glucose 11/19/2010  . Morbid obesity (HCC) 11/19/2010   Past Medical History:  Diagnosis Date  . Anginal pain (HCC)     04/2011 pt had agina, seen by cardiologist, no further treatment  . Arthritis   . Complication of anesthesia    " hard time due to sleep apnea - wtih colonoscopy in 2018   . GERD (gastroesophageal reflux disease)   . High cholesterol   . Hypertension   . Pneumonia    hx of   . Pre-diabetes   . Sleep apnea    cpap- does not know settings   .  Swelling    in lower extremities     Family History  Problem Relation Age of Onset  . Diabetes Mother   . Hypertension Mother   . Stroke Mother   . Hypertension Father   . Diabetes Father   . Lung cancer Father     Past Surgical History:  Procedure Laterality Date  . COLONOSCOPY  2018  . left carpal tunnel release      Social History   Occupational History  . 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 Smoking status: Never Smoker  . Smokeless tobacco: Never Used  Vaping Use  . Vaping Use: Never used  Substance and Sexual Activity  . Alcohol use: No    Comment: 1-2 x per month   . Drug use: No  . Sexual activity: Not on file

## 2021-04-06 MED ORDER — LIDOCAINE HCL 1 % IJ SOLN
5.0000 mL | INTRAMUSCULAR | Status: AC | PRN
  Administered 2021-03-12: 5 mL

## 2021-04-06 MED ORDER — METHYLPREDNISOLONE ACETATE 40 MG/ML IJ SUSP
40.0000 mg | INTRAMUSCULAR | Status: AC | PRN
  Administered 2021-03-12: 40 mg via INTRA_ARTICULAR

## 2021-04-06 MED ORDER — LIDOCAINE HCL 1 % IJ SOLN
5.0000 mL | INTRAMUSCULAR | Status: AC | PRN
Start: 2021-03-12 — End: 2021-03-12
  Administered 2021-03-12: 5 mL

## 2023-07-31 LAB — COLOGUARD: COLOGUARD: NEGATIVE

## 2024-02-21 ENCOUNTER — Other Ambulatory Visit: Payer: Self-pay | Admitting: Medical Genetics

## 2024-07-31 ENCOUNTER — Telehealth: Admitting: Family Medicine

## 2024-07-31 DIAGNOSIS — Z91199 Patient's noncompliance with other medical treatment and regimen due to unspecified reason: Secondary | ICD-10-CM

## 2024-07-31 MED ORDER — PREDNISONE 10 MG PO TABS: 20.0000 mg | ORAL_TABLET | Freq: Every day | ORAL | 0 refills | Status: DC

## 2024-07-31 NOTE — Progress Notes (Signed)
 [place]   The patient no-showed for appointment despite this provider sending direct link, reaching out via phone with no response and waiting for at least 10 minutes from appointment time for patient to join. They will be marked as a NS for this appointment/time.   Duane CHRISTELLA Barefoot, NP     In chart review- appears provider has already ordered the prednisone  he is requesting.

## 2024-08-21 ENCOUNTER — Other Ambulatory Visit: Payer: Self-pay | Admitting: Medical Genetics

## 2024-08-21 DIAGNOSIS — Z006 Encounter for examination for normal comparison and control in clinical research program: Secondary | ICD-10-CM

## 2024-08-26 ENCOUNTER — Other Ambulatory Visit: Payer: Self-pay | Admitting: Family

## 2024-09-02 ENCOUNTER — Encounter: Payer: Self-pay | Admitting: Radiology

## 2024-09-10 ENCOUNTER — Other Ambulatory Visit: Payer: Self-pay | Admitting: Medical Genetics

## 2024-09-10 DIAGNOSIS — Z006 Encounter for examination for normal comparison and control in clinical research program: Secondary | ICD-10-CM

## 2024-09-10 NOTE — Addendum Note (Signed)
 Addended by: PENNSTROM, JORDYN on: 09/10/2024 12:53 PM   Modules accepted: Orders
